# Patient Record
Sex: Male | Born: 1965 | Race: Black or African American | Hispanic: No | Smoking: Never smoker
Health system: Southern US, Community
[De-identification: ages and names within clinical notes are randomized; demographics above are authoritative.]

## PROBLEM LIST (undated history)

## (undated) DIAGNOSIS — J45909 Unspecified asthma, uncomplicated: Secondary | ICD-10-CM

## (undated) DIAGNOSIS — I10 Essential (primary) hypertension: Secondary | ICD-10-CM

## (undated) DIAGNOSIS — F329 Major depressive disorder, single episode, unspecified: Secondary | ICD-10-CM

## (undated) DIAGNOSIS — F209 Schizophrenia, unspecified: Secondary | ICD-10-CM

## (undated) DIAGNOSIS — J209 Acute bronchitis, unspecified: Secondary | ICD-10-CM

## (undated) DIAGNOSIS — F32A Depression, unspecified: Secondary | ICD-10-CM

---

## 1998-05-16 ENCOUNTER — Emergency Department (HOSPITAL_COMMUNITY): Admission: EM | Admit: 1998-05-16 | Discharge: 1998-05-16 | Payer: Self-pay | Admitting: Emergency Medicine

## 2000-07-16 ENCOUNTER — Encounter: Payer: Self-pay | Admitting: Emergency Medicine

## 2000-07-16 ENCOUNTER — Emergency Department (HOSPITAL_COMMUNITY): Admission: EM | Admit: 2000-07-16 | Discharge: 2000-07-16 | Payer: Self-pay | Admitting: Emergency Medicine

## 2003-05-28 ENCOUNTER — Emergency Department (HOSPITAL_COMMUNITY): Admission: AD | Admit: 2003-05-28 | Discharge: 2003-05-28 | Payer: Self-pay | Admitting: Emergency Medicine

## 2004-09-04 ENCOUNTER — Ambulatory Visit: Payer: Self-pay | Admitting: Internal Medicine

## 2004-09-18 ENCOUNTER — Ambulatory Visit: Payer: Self-pay | Admitting: Internal Medicine

## 2005-03-19 ENCOUNTER — Ambulatory Visit: Payer: Self-pay | Admitting: Family Medicine

## 2005-06-15 ENCOUNTER — Emergency Department (HOSPITAL_COMMUNITY): Admission: EM | Admit: 2005-06-15 | Discharge: 2005-06-15 | Payer: Self-pay | Admitting: Emergency Medicine

## 2006-03-07 ENCOUNTER — Ambulatory Visit: Payer: Self-pay | Admitting: Family Medicine

## 2006-08-13 ENCOUNTER — Encounter: Payer: Self-pay | Admitting: Family Medicine

## 2007-04-23 DIAGNOSIS — F329 Major depressive disorder, single episode, unspecified: Secondary | ICD-10-CM | POA: Insufficient documentation

## 2007-04-23 DIAGNOSIS — I10 Essential (primary) hypertension: Secondary | ICD-10-CM | POA: Insufficient documentation

## 2007-04-23 DIAGNOSIS — J45909 Unspecified asthma, uncomplicated: Secondary | ICD-10-CM | POA: Insufficient documentation

## 2007-04-23 DIAGNOSIS — F3289 Other specified depressive episodes: Secondary | ICD-10-CM | POA: Insufficient documentation

## 2007-04-27 ENCOUNTER — Ambulatory Visit: Payer: Self-pay | Admitting: Family Medicine

## 2007-04-27 DIAGNOSIS — F200 Paranoid schizophrenia: Secondary | ICD-10-CM | POA: Insufficient documentation

## 2007-04-27 LAB — CONVERTED CEMR LAB
Bilirubin Urine: NEGATIVE
Blood in Urine, dipstick: NEGATIVE
Glucose, Urine, Semiquant: NEGATIVE
Ketones, urine, test strip: NEGATIVE
Nitrite: NEGATIVE
Protein, U semiquant: NEGATIVE
Specific Gravity, Urine: 1.015
Urobilinogen, UA: NEGATIVE
WBC Urine, dipstick: NEGATIVE
pH: 5

## 2007-04-28 ENCOUNTER — Telehealth (INDEPENDENT_AMBULATORY_CARE_PROVIDER_SITE_OTHER): Payer: Self-pay | Admitting: *Deleted

## 2007-04-28 LAB — CONVERTED CEMR LAB
ALT: 27 units/L (ref 0–40)
AST: 19 units/L (ref 0–37)
Albumin: 4 g/dL (ref 3.5–5.2)
Alkaline Phosphatase: 71 units/L (ref 39–117)
BUN: 12 mg/dL (ref 6–23)
Basophils Absolute: 0 10*3/uL (ref 0.0–0.1)
Basophils Relative: 0.3 % (ref 0.0–1.0)
Bilirubin, Direct: 0.1 mg/dL (ref 0.0–0.3)
CO2: 32 meq/L (ref 19–32)
Calcium: 9.3 mg/dL (ref 8.4–10.5)
Chloride: 110 meq/L (ref 96–112)
Cholesterol: 293 mg/dL (ref 0–200)
Creatinine, Ser: 1.1 mg/dL (ref 0.4–1.5)
Direct LDL: 192.3 mg/dL
Eosinophils Absolute: 0.1 10*3/uL (ref 0.0–0.6)
Eosinophils Relative: 1.1 % (ref 0.0–5.0)
GFR calc Af Amer: 96 mL/min
GFR calc non Af Amer: 79 mL/min
Glucose, Bld: 74 mg/dL (ref 70–99)
HCT: 51.3 % (ref 39.0–52.0)
HDL: 50.6 mg/dL (ref >39.0)
Hemoglobin: 17.2 g/dL — ABNORMAL HIGH (ref 13.0–17.0)
Lymphocytes Relative: 13 % (ref 12.0–46.0)
MCHC: 33.4 g/dL (ref 30.0–36.0)
MCV: 89.3 fL (ref 78.0–100.0)
Monocytes Absolute: 0.4 10*3/uL (ref 0.2–0.7)
Monocytes Relative: 5.2 % (ref 3.0–11.0)
Neutro Abs: 6.8 10*3/uL (ref 1.4–7.7)
Neutrophils Relative %: 80.4 % — ABNORMAL HIGH (ref 43.0–77.0)
Platelets: 244 10*3/uL (ref 150–400)
Potassium: 4 meq/L (ref 3.5–5.1)
RBC: 5.74 M/uL (ref 4.22–5.81)
RDW: 12.4 % (ref 11.5–14.6)
Sodium: 145 meq/L (ref 135–145)
TSH: 0.71 microintl units/mL (ref 0.35–5.50)
Total Bilirubin: 0.8 mg/dL (ref 0.3–1.2)
Total CHOL/HDL Ratio: 5.8
Total Protein: 7.4 g/dL (ref 6.0–8.3)
Triglycerides: 165 mg/dL — ABNORMAL HIGH (ref 0–149)
VLDL: 33 mg/dL (ref 0–40)
WBC: 8.4 10*3/uL (ref 4.5–10.5)

## 2007-11-04 ENCOUNTER — Telehealth (INDEPENDENT_AMBULATORY_CARE_PROVIDER_SITE_OTHER): Payer: Self-pay | Admitting: *Deleted

## 2009-01-24 ENCOUNTER — Ambulatory Visit: Payer: Self-pay | Admitting: Family Medicine

## 2009-01-24 DIAGNOSIS — J209 Acute bronchitis, unspecified: Secondary | ICD-10-CM

## 2010-08-11 ENCOUNTER — Emergency Department (HOSPITAL_COMMUNITY): Admission: EM | Admit: 2010-08-11 | Discharge: 2010-08-11 | Payer: Self-pay | Admitting: Emergency Medicine

## 2011-11-06 DIAGNOSIS — E119 Type 2 diabetes mellitus without complications: Secondary | ICD-10-CM | POA: Diagnosis not present

## 2011-12-13 DIAGNOSIS — F200 Paranoid schizophrenia: Secondary | ICD-10-CM | POA: Diagnosis not present

## 2012-03-05 DIAGNOSIS — J309 Allergic rhinitis, unspecified: Secondary | ICD-10-CM | POA: Diagnosis not present

## 2012-03-05 DIAGNOSIS — E1110 Type 2 diabetes mellitus with ketoacidosis without coma: Secondary | ICD-10-CM | POA: Diagnosis not present

## 2012-03-05 DIAGNOSIS — I10 Essential (primary) hypertension: Secondary | ICD-10-CM | POA: Diagnosis not present

## 2012-03-05 DIAGNOSIS — F2089 Other schizophrenia: Secondary | ICD-10-CM | POA: Diagnosis not present

## 2012-03-05 DIAGNOSIS — G4730 Sleep apnea, unspecified: Secondary | ICD-10-CM | POA: Diagnosis not present

## 2012-03-05 DIAGNOSIS — E7800 Pure hypercholesterolemia, unspecified: Secondary | ICD-10-CM | POA: Diagnosis not present

## 2012-03-05 DIAGNOSIS — E119 Type 2 diabetes mellitus without complications: Secondary | ICD-10-CM | POA: Diagnosis not present

## 2012-03-11 DIAGNOSIS — F200 Paranoid schizophrenia: Secondary | ICD-10-CM | POA: Diagnosis not present

## 2012-05-04 DIAGNOSIS — I10 Essential (primary) hypertension: Secondary | ICD-10-CM | POA: Diagnosis not present

## 2012-06-16 DIAGNOSIS — F200 Paranoid schizophrenia: Secondary | ICD-10-CM | POA: Diagnosis not present

## 2012-07-03 DIAGNOSIS — R062 Wheezing: Secondary | ICD-10-CM | POA: Diagnosis not present

## 2012-08-10 DIAGNOSIS — E119 Type 2 diabetes mellitus without complications: Secondary | ICD-10-CM | POA: Diagnosis not present

## 2012-08-10 DIAGNOSIS — E7800 Pure hypercholesterolemia, unspecified: Secondary | ICD-10-CM | POA: Diagnosis not present

## 2012-08-10 DIAGNOSIS — I10 Essential (primary) hypertension: Secondary | ICD-10-CM | POA: Diagnosis not present

## 2012-09-01 DIAGNOSIS — F200 Paranoid schizophrenia: Secondary | ICD-10-CM | POA: Diagnosis not present

## 2012-11-09 DIAGNOSIS — Z23 Encounter for immunization: Secondary | ICD-10-CM | POA: Diagnosis not present

## 2012-11-09 DIAGNOSIS — E119 Type 2 diabetes mellitus without complications: Secondary | ICD-10-CM | POA: Diagnosis not present

## 2012-11-09 DIAGNOSIS — E781 Pure hyperglyceridemia: Secondary | ICD-10-CM | POA: Diagnosis not present

## 2012-11-09 DIAGNOSIS — E7800 Pure hypercholesterolemia, unspecified: Secondary | ICD-10-CM | POA: Diagnosis not present

## 2012-11-09 DIAGNOSIS — Z125 Encounter for screening for malignant neoplasm of prostate: Secondary | ICD-10-CM | POA: Diagnosis not present

## 2012-11-09 DIAGNOSIS — I10 Essential (primary) hypertension: Secondary | ICD-10-CM | POA: Diagnosis not present

## 2013-01-04 DIAGNOSIS — F200 Paranoid schizophrenia: Secondary | ICD-10-CM | POA: Diagnosis not present

## 2013-04-06 DIAGNOSIS — F200 Paranoid schizophrenia: Secondary | ICD-10-CM | POA: Diagnosis not present

## 2013-06-28 DIAGNOSIS — F209 Schizophrenia, unspecified: Secondary | ICD-10-CM | POA: Diagnosis not present

## 2013-06-28 DIAGNOSIS — F200 Paranoid schizophrenia: Secondary | ICD-10-CM | POA: Diagnosis not present

## 2013-09-20 DIAGNOSIS — F209 Schizophrenia, unspecified: Secondary | ICD-10-CM | POA: Diagnosis not present

## 2013-12-10 DIAGNOSIS — F209 Schizophrenia, unspecified: Secondary | ICD-10-CM | POA: Diagnosis not present

## 2013-12-16 DIAGNOSIS — E781 Pure hyperglyceridemia: Secondary | ICD-10-CM | POA: Diagnosis not present

## 2013-12-16 DIAGNOSIS — F2089 Other schizophrenia: Secondary | ICD-10-CM | POA: Diagnosis not present

## 2013-12-16 DIAGNOSIS — E119 Type 2 diabetes mellitus without complications: Secondary | ICD-10-CM | POA: Diagnosis not present

## 2013-12-16 DIAGNOSIS — I10 Essential (primary) hypertension: Secondary | ICD-10-CM | POA: Diagnosis not present

## 2014-02-28 DIAGNOSIS — F209 Schizophrenia, unspecified: Secondary | ICD-10-CM | POA: Diagnosis not present

## 2014-05-23 DIAGNOSIS — F209 Schizophrenia, unspecified: Secondary | ICD-10-CM | POA: Diagnosis not present

## 2014-08-15 DIAGNOSIS — F209 Schizophrenia, unspecified: Secondary | ICD-10-CM | POA: Diagnosis not present

## 2015-02-20 DIAGNOSIS — F209 Schizophrenia, unspecified: Secondary | ICD-10-CM | POA: Diagnosis not present

## 2015-02-24 DIAGNOSIS — I10 Essential (primary) hypertension: Secondary | ICD-10-CM | POA: Diagnosis not present

## 2015-02-24 DIAGNOSIS — E782 Mixed hyperlipidemia: Secondary | ICD-10-CM | POA: Diagnosis not present

## 2015-02-24 DIAGNOSIS — J4521 Mild intermittent asthma with (acute) exacerbation: Secondary | ICD-10-CM | POA: Diagnosis not present

## 2015-02-24 DIAGNOSIS — E0800 Diabetes mellitus due to underlying condition with hyperosmolarity without nonketotic hyperglycemic-hyperosmolar coma (NKHHC): Secondary | ICD-10-CM | POA: Diagnosis not present

## 2015-03-29 DIAGNOSIS — E0800 Diabetes mellitus due to underlying condition with hyperosmolarity without nonketotic hyperglycemic-hyperosmolar coma (NKHHC): Secondary | ICD-10-CM | POA: Diagnosis not present

## 2015-03-29 DIAGNOSIS — I10 Essential (primary) hypertension: Secondary | ICD-10-CM | POA: Diagnosis not present

## 2015-03-29 DIAGNOSIS — E782 Mixed hyperlipidemia: Secondary | ICD-10-CM | POA: Diagnosis not present

## 2015-05-18 DIAGNOSIS — F209 Schizophrenia, unspecified: Secondary | ICD-10-CM | POA: Diagnosis not present

## 2015-05-30 DIAGNOSIS — E782 Mixed hyperlipidemia: Secondary | ICD-10-CM | POA: Diagnosis not present

## 2015-05-30 DIAGNOSIS — I10 Essential (primary) hypertension: Secondary | ICD-10-CM | POA: Diagnosis not present

## 2015-05-30 DIAGNOSIS — E0800 Diabetes mellitus due to underlying condition with hyperosmolarity without nonketotic hyperglycemic-hyperosmolar coma (NKHHC): Secondary | ICD-10-CM | POA: Diagnosis not present

## 2015-07-15 DIAGNOSIS — E0800 Diabetes mellitus due to underlying condition with hyperosmolarity without nonketotic hyperglycemic-hyperosmolar coma (NKHHC): Secondary | ICD-10-CM | POA: Diagnosis not present

## 2015-07-15 DIAGNOSIS — J4521 Mild intermittent asthma with (acute) exacerbation: Secondary | ICD-10-CM | POA: Diagnosis not present

## 2015-07-15 DIAGNOSIS — I10 Essential (primary) hypertension: Secondary | ICD-10-CM | POA: Diagnosis not present

## 2015-08-14 DIAGNOSIS — F209 Schizophrenia, unspecified: Secondary | ICD-10-CM | POA: Diagnosis not present

## 2015-09-13 DIAGNOSIS — I10 Essential (primary) hypertension: Secondary | ICD-10-CM | POA: Diagnosis not present

## 2015-09-13 DIAGNOSIS — J4521 Mild intermittent asthma with (acute) exacerbation: Secondary | ICD-10-CM | POA: Diagnosis not present

## 2015-09-13 DIAGNOSIS — E118 Type 2 diabetes mellitus with unspecified complications: Secondary | ICD-10-CM | POA: Diagnosis not present

## 2015-09-13 DIAGNOSIS — F2089 Other schizophrenia: Secondary | ICD-10-CM | POA: Diagnosis not present

## 2015-11-07 DIAGNOSIS — F209 Schizophrenia, unspecified: Secondary | ICD-10-CM | POA: Diagnosis not present

## 2018-04-24 ENCOUNTER — Emergency Department (HOSPITAL_COMMUNITY)
Admission: EM | Admit: 2018-04-24 | Discharge: 2018-04-27 | Disposition: A | Discharge status: Other Acute Inpatient Hospital | Payer: Medicare Other | Attending: Emergency Medicine | Admitting: Emergency Medicine

## 2018-04-24 ENCOUNTER — Encounter (HOSPITAL_COMMUNITY): Payer: Self-pay | Admitting: Nurse Practitioner

## 2018-04-24 DIAGNOSIS — F200 Paranoid schizophrenia: Secondary | ICD-10-CM | POA: Diagnosis not present

## 2018-04-24 DIAGNOSIS — I10 Essential (primary) hypertension: Secondary | ICD-10-CM | POA: Insufficient documentation

## 2018-04-24 DIAGNOSIS — J45909 Unspecified asthma, uncomplicated: Secondary | ICD-10-CM | POA: Diagnosis not present

## 2018-04-24 DIAGNOSIS — Z87891 Personal history of nicotine dependence: Secondary | ICD-10-CM | POA: Insufficient documentation

## 2018-04-24 DIAGNOSIS — F329 Major depressive disorder, single episode, unspecified: Secondary | ICD-10-CM | POA: Diagnosis present

## 2018-04-24 HISTORY — DX: Schizophrenia, unspecified: F20.9

## 2018-04-24 HISTORY — DX: Depression, unspecified: F32.A

## 2018-04-24 HISTORY — DX: Unspecified asthma, uncomplicated: J45.909

## 2018-04-24 HISTORY — DX: Acute bronchitis, unspecified: J20.9

## 2018-04-24 HISTORY — DX: Essential (primary) hypertension: I10

## 2018-04-24 HISTORY — DX: Major depressive disorder, single episode, unspecified: F32.9

## 2018-04-24 LAB — COMPREHENSIVE METABOLIC PANEL
ALK PHOS: 65 U/L (ref 38–126)
ALT: 19 U/L (ref 17–63)
ANION GAP: 7 (ref 5–15)
AST: 20 U/L (ref 15–41)
Albumin: 3.8 g/dL (ref 3.5–5.0)
BILIRUBIN TOTAL: 0.8 mg/dL (ref 0.3–1.2)
BUN: 9 mg/dL (ref 6–20)
CO2: 26 mmol/L (ref 22–32)
Calcium: 8.8 mg/dL — ABNORMAL LOW (ref 8.9–10.3)
Chloride: 107 mmol/L (ref 101–111)
Creatinine, Ser: 1.06 mg/dL (ref 0.61–1.24)
Glucose, Bld: 97 mg/dL (ref 65–99)
Potassium: 4.1 mmol/L (ref 3.5–5.1)
SODIUM: 140 mmol/L (ref 135–145)
TOTAL PROTEIN: 6.6 g/dL (ref 6.5–8.1)

## 2018-04-24 LAB — ACETAMINOPHEN LEVEL

## 2018-04-24 LAB — RAPID URINE DRUG SCREEN, HOSP PERFORMED
Amphetamines: NOT DETECTED
Benzodiazepines: NOT DETECTED
COCAINE: NOT DETECTED
OPIATES: NOT DETECTED
Tetrahydrocannabinol: NOT DETECTED

## 2018-04-24 LAB — CBC
HCT: 43.2 % (ref 39.0–52.0)
Hemoglobin: 14.8 g/dL (ref 13.0–17.0)
MCH: 30.5 pg (ref 26.0–34.0)
MCHC: 34.3 g/dL (ref 30.0–36.0)
MCV: 89.1 fL (ref 78.0–100.0)
Platelets: 292 10*3/uL (ref 150–400)
RBC: 4.85 MIL/uL (ref 4.22–5.81)
RDW: 13.7 % (ref 11.5–15.5)
WBC: 9.2 10*3/uL (ref 4.0–10.5)

## 2018-04-24 LAB — ETHANOL

## 2018-04-24 LAB — SALICYLATE LEVEL

## 2018-04-24 MED ORDER — ACETAMINOPHEN 325 MG PO TABS: 650.0000 mg | ORAL_TABLET | ORAL | Status: DC | PRN

## 2018-04-24 MED ORDER — ONDANSETRON HCL 4 MG PO TABS: 4.0000 mg | ORAL_TABLET | Freq: Three times a day (TID) | ORAL | Status: DC | PRN

## 2018-04-24 MED ORDER — OLANZAPINE 10 MG PO TBDP
10.0000 mg | ORAL_TABLET | Freq: Once | ORAL | Status: AC
  Administered 2018-04-24: 10 mg via ORAL
  Filled 2018-04-24: qty 1

## 2018-04-24 MED ORDER — ALUM & MAG HYDROXIDE-SIMETH 200-200-20 MG/5ML PO SUSP: 30.0000 mL | Freq: Four times a day (QID) | ORAL | Status: DC | PRN

## 2018-04-24 NOTE — Progress Notes (Signed)
IVC states the pt's mother is his legal guardian, however chart does not support this information. Will need verification.   Princess BruinsAquicha Chris Narasimhan, MSW, LCSW Therapeutic Triage Specialist  [phone]

## 2018-04-24 NOTE — ED Triage Notes (Signed)
Pt IVC'd and presented by [employer], reportedly behaving erratic for the past few weeks, threatening to kill his mother and sister and hallucinating by talking to Obama and his deceased grandparents. Pt has a known psychiatric hx of schizophrenia and reportedly has not taken his medications for the last 3 years. Pt is calm and coopertive at this time.

## 2018-04-24 NOTE — ED Notes (Signed)
Bed: [id number] Expected date:  Expected time:  Means of arrival:  Comments: Med clearance with [employer]

## 2018-04-24 NOTE — Progress Notes (Signed)
TTS consulted with Donell SievertSpencer Simon, PA pt is recommended for inpt treatment. EDP Tilden Fossaees, Elizabeth, MD has been advised. Baxter HireKristen, RN has also been advised of the recommendation.   Princess BruinsAquicha Notnamed Croucher, MSW, LCSW Therapeutic Triage Specialist  [phone]

## 2018-04-24 NOTE — BH Assessment (Addendum)
Assessment Note  [name] [name] is an 52 y.o. male who presents to the ED under IVC initiated by his mother. According to the IVC, the respondent is not taking his medication, "believes he talks to Obama and his dead grandparents, thinks everyone is trying to hurt him or is talking about him; has threatened to kill his mother, sister, and his entire family; he sometimes walks outside without clothes on; behavior has become more and more erratic over the last weeks."  Pt appeared disoriented during the assessment and when asked to verify his DOB pt stated "[name] [name]." Pt is fixated on his grandparents and continues to make disorganized statements such as "I'm with my grandparents, 2011, I think about my grandparents. I want my wedding band, can you find my band, it was my grandparents, it took place in 2011, I guess."   TTS consulted with Donell SievertSpencer Simon, PA pt is recommended for inpt treatment. EDP Tilden Fossaees, Elizabeth, MD has been advised. Baxter HireKristen, RN has also been advised of the recommendation.   Diagnosis: Schizophrenia   Past Medical History:  Past Medical History:  Diagnosis Date  . Acute bronchitis   . Asthma   . Depression   . Hypertension   . Schizophrenia (HCC)     History reviewed. No pertinent surgical history.  Family History: No family history on file.  Social History:  reports that he has quit smoking. He does not have any smokeless tobacco history on file. He reports that he drank alcohol. He reports that he has current or past drug history.  Additional Social History:  Alcohol / Drug Use Pain Medications: See MAR Prescriptions: See MAR Over the Counter: See MAR History of alcohol / drug use?: No history of alcohol / drug abuse  CIWA: CIWA-Ar BP: (!) 134/98 Pulse Rate: 91 COWS:    Allergies:  Allergies  Allergen Reactions  . Cefprozil   . Cetirizine Hcl   . Doxycycline     Home Medications:  (Not in a hospital admission)  OB/GYN Status:  No LMP for male  patient.  General Assessment Data Location of Assessment: [facility] TTS Assessment: In system Is this a Tele or Face-to-Face Assessment?: Face-to-Face Is this an Initial Assessment or a Re-assessment for this encounter?: Initial Assessment Marital status: Single Is patient pregnant?: No Pregnancy Status: No Living Arrangements: Alone Can pt return to current living arrangement?: Yes Admission Status: Involuntary Is patient capable of signing voluntary admission?: No Referral Source: Self/Family/Friend Insurance type: none     Crisis Care Plan Living Arrangements: Alone Legal Guardian: Mother(per IVC, mother is guardian, Starling Mannsmma Fortier) Name of Psychiatrist: UTA due to AMS Name of Therapist: UTA due to AMS  Education Status Is patient currently in school?: No Is the patient employed, unemployed or receiving disability?: Unemployed  Risk to self with the past 6 months Suicidal Ideation: No(denies) Has patient been a risk to self within the past 6 months prior to admission? : No Suicidal Intent: No Has patient had any suicidal intent within the past 6 months prior to admission? : No Is patient at risk for suicide?: No Suicidal Plan?: No Has patient had any suicidal plan within the past 6 months prior to admission? : No Access to Means: No What has been your use of drugs/alcohol within the last 12 months?: denies Previous Attempts/Gestures: No Triggers for Past Attempts: None known Intentional Self Injurious Behavior: None Family Suicide History: No Recent stressful life event(s): Turmoil (Comment)(delusions, not taking meds ) Persecutory voices/beliefs?: No Depression:  No Substance abuse history and/or treatment for substance abuse?: No Suicide prevention information given to non-admitted patients: Not applicable  Risk to Others within the past 6 months Homicidal Ideation: Yes-Currently Present(IVC states pt threatened to kill family) Does patient have any lifetime risk of  violence toward others beyond the six months prior to admission? : Yes (comment)(IVC states pt threatened family) Thoughts of Harm to Others: Yes-Currently Present(per IVC) Comment - Thoughts of Harm to Others: IVC states pt threatened to kill family Current Homicidal Intent: No Current Homicidal Plan: No Access to Homicidal Means: No Identified Victim: family(per IVC) History of harm to others?: No Assessment of Violence: None Noted Does patient have access to weapons?: No Criminal Charges Pending?: No Does patient have a court date: No Is patient on probation?: No  Psychosis Hallucinations: Auditory, Visual Delusions: Grandiose  Mental Status Report Appearance/Hygiene: Disheveled, In scrubs Eye Contact: Fair Motor Activity: Freedom of movement Speech: Pressured, Loud Level of Consciousness: Alert Mood: Labile, Preoccupied Affect: Labile Anxiety Level: None Thought Processes: Irrelevant, Flight of Ideas Judgement: Impaired Orientation: Not oriented Obsessive Compulsive Thoughts/Behaviors: None  Cognitive Functioning Concentration: Poor Memory: Remote Impaired, Recent Impaired Is patient IDD: No Is patient DD?: No Insight: Poor Impulse Control: Poor Appetite: Fair Have you had any weight changes? : No Change Sleep: Unable to Assess Vegetative Symptoms: Unable to Assess  ADLScreening [facility] Assessment Services) Patient's cognitive ability adequate to safely complete daily activities?: Yes Patient able to express need for assistance with ADLs?: Yes Independently performs ADLs?: Yes (appropriate for developmental age)  Prior Inpatient Therapy Prior Inpatient Therapy: (UTA due to AMS)  Prior Outpatient Therapy Prior Outpatient Therapy: (UTA due to AMS)  ADL Screening (condition at time of admission) Patient's cognitive ability adequate to safely complete daily activities?: Yes Is the patient deaf or have difficulty hearing?: No Does the patient have difficulty  seeing, even when wearing glasses/contacts?: No Does the patient have difficulty concentrating, remembering, or making decisions?: Yes Patient able to express need for assistance with ADLs?: Yes Does the patient have difficulty dressing or bathing?: No Independently performs ADLs?: Yes (appropriate for developmental age) Does the patient have difficulty walking or climbing stairs?: No Weakness of Legs: None Weakness of Arms/Hands: None  Home Assistive Devices/Equipment Home Assistive Devices/Equipment: None    Abuse/Neglect Assessment (Assessment to be complete while patient is alone) Abuse/Neglect Assessment Can Be Completed: Unable to assess, patient is non-responsive or altered mental status     Advance Directives (For Healthcare) Does Patient Have a Medical Advance Directive?: No Would patient like information on creating a medical advance directive?: No - Patient declined    Additional Information 1:1 In Past 12 Months?: No CIRT Risk: Yes Elopement Risk: Yes Does patient have medical clearance?: Yes     Disposition: TTS consulted with Donell Sievert, PA pt is recommended for inpt treatment. EDP Tilden Fossa, MD has been advised. Baxter Hire, RN has also been advised of the recommendation.   Disposition Initial Assessment Completed for this Encounter: Yes Disposition of Patient: Admit Type of inpatient treatment program: Adult(per Donell Sievert, PA) Patient refused recommended treatment: No  On Site Evaluation by:   Reviewed with Physician:    Karolee Ohs 04/24/2018 8:17 PM

## 2018-04-24 NOTE — ED Provider Notes (Signed)
[facility]-EMERGENCY DEPT Provider Note   CSN: [id number] Arrival date & time: 04/24/18  1702     History   Chief Complaint Chief Complaint  Patient presents with  . Schizophrenia    HPI [name] [name] is a 52 y.o. male.  The history is provided by the patient. No language interpreter was used.    [name] [name] is a 52 y.o. male who presents to the Emergency Department complaining of psychiatric evaluation.  Level V caveat due to psychiatric disorder. He is brought to the emergency department under IVC for evaluation of bizarre behavior. Per IVC he is a danger to his self and others. He has a history of paranoid schizophrenia and has been prescribed Risperdal but has not been on it for three years. He believes he talks with Obama and his dead grandparents. He thinks everyone is trying to hurt him or is talking about him. He has threatened to kill his mother, sister and entire family. He sometimes walks outside without clothes on. Behavior has become more and more erratic over the last few weeks. Patient does not know why he is in the emergency department but says that he has been with his grandparents. He also repeatedly states [place]belline Zannie Kehr. He denies SI or HI in the emergency department. He states he has a history of schizophrenia but does not take medications. He does endorse drinking alcohol. He denies tobacco or drug use.  Past Medical History:  Diagnosis Date  . Acute bronchitis   . Asthma   . Depression   . Hypertension   . Schizophrenia [facility])     Patient Active Problem List   Diagnosis Date Noted  . ACUTE BRONCHITIS 01/24/2009  . SCHIZOPHRENIA, PARANOID, CHRONIC 04/27/2007  . DEPRESSION 04/23/2007  . HYPERTENSION 04/23/2007  . ASTHMA 04/23/2007    History reviewed. No pertinent surgical history.      Home Medications    Prior to Admission medications   Not on File    Family History No family history on file.  Social  History Social History   Tobacco Use  . Smoking status: Former Smoker  Substance Use Topics  . Alcohol use: Not Currently  . Drug use: Not Currently     Allergies   Cefprozil; Cetirizine hcl; and Doxycycline   Review of Systems Review of Systems  All other systems reviewed and are negative.    Physical Exam Updated Vital Signs BP 110/65 (BP Location: Right Arm)   Pulse 60   Temp 97.6 F (36.4 C) (Oral)   Resp 15   SpO2 98%   Physical Exam  Constitutional: He is oriented to person, place, and time. He appears well-developed and well-nourished.  HENT:  Head: Normocephalic and atraumatic.  Cardiovascular: Normal rate and regular rhythm.  No murmur heard. Pulmonary/Chest: Effort normal and breath sounds normal. No respiratory distress.  Abdominal: Soft. There is no tenderness. There is no rebound and no guarding.  Musculoskeletal: He exhibits no edema or tenderness.  Neurological: He is alert and oriented to person, place, and time.  Skin: Skin is warm and dry.  Psychiatric:  Mildly agitated. Tangential speech. Mildly paranoid.  Nursing note and vitals reviewed.    ED Treatments / Results  Labs (all labs ordered are listed, but only abnormal results are displayed) Labs Reviewed  COMPREHENSIVE METABOLIC PANEL - Abnormal; Notable for the following components:      Result Value   Calcium 8.8 (*)    All other components within  normal limits  ACETAMINOPHEN LEVEL - Abnormal; Notable for the following components:   Acetaminophen (Tylenol), Serum <10 (*)    All other components within normal limits  RAPID URINE DRUG SCREEN, HOSP PERFORMED - Abnormal; Notable for the following components:   Barbiturates   (*)    Value: Result not available. Reagent lot number recalled by manufacturer.   All other components within normal limits  ETHANOL  SALICYLATE LEVEL  CBC    EKG None  Radiology No results found.  Procedures Procedures (including critical care  time)  Medications Ordered in ED Medications  acetaminophen (TYLENOL) tablet 650 mg (has no administration in time range)  ondansetron (ZOFRAN) tablet 4 mg (has no administration in time range)  alum & mag hydroxide-simeth (MAALOX/MYLANTA) 200-200-20 MG/5ML suspension 30 mL (has no administration in time range)  OLANZapine zydis (ZYPREXA) disintegrating tablet 10 mg (10 mg Oral Given 04/24/18 1742)     Initial Impression / Assessment and Plan / ED Course  I have reviewed the triage vital signs and the nursing notes.  Pertinent labs & imaging results that were available during my care of the patient were reviewed by me and considered in my medical decision making (see chart for details).     Patient here under IVC for mania with homicidal ideation. Patient is mildly agitated but cooperative in the emergency department. He has been medically cleared for psychiatric evaluation and treatment.  Final Clinical Impressions(s) / ED Diagnoses   Final diagnoses:  None    ED Discharge Orders    None       Tilden Fossaees, Inice Sanluis, MD 04/25/18 [phone]

## 2018-04-25 ENCOUNTER — Other Ambulatory Visit: Payer: Self-pay

## 2018-04-25 DIAGNOSIS — F200 Paranoid schizophrenia: Secondary | ICD-10-CM

## 2018-04-25 MED ORDER — OXCARBAZEPINE 300 MG PO TABS
300.0000 mg | ORAL_TABLET | Freq: Two times a day (BID) | ORAL | Status: DC
  Administered 2018-04-25 – 2018-04-27 (×5): 300 mg via ORAL
  Filled 2018-04-25 (×5): qty 1

## 2018-04-25 MED ORDER — RISPERIDONE 1 MG PO TABS
1.0000 mg | ORAL_TABLET | Freq: Two times a day (BID) | ORAL | Status: DC
  Administered 2018-04-25 – 2018-04-27 (×5): 1 mg via ORAL
  Filled 2018-04-25 (×5): qty 1

## 2018-04-25 MED ORDER — BENZTROPINE MESYLATE 0.5 MG PO TABS
0.5000 mg | ORAL_TABLET | Freq: Two times a day (BID) | ORAL | Status: DC
  Administered 2018-04-25 – 2018-04-27 (×5): 0.5 mg via ORAL
  Filled 2018-04-25 (×5): qty 1

## 2018-04-25 NOTE — ED Notes (Signed)
Patient is mute at this time will not talk to Clinical research associatewriter at this time. Encouragement and support provided and safety maintain. Q 15 min safety checks remain in place and video monitoring.

## 2018-04-25 NOTE — Progress Notes (Signed)
CSW created and then received he signed 1st opinion/exam from the [facility]PD and placed copies into the IVC logbook and into the pt's chart.  Please reconsult if future social work needs arise.  CSW signing off, as social work intervention is no longer needed.  Jaelle Campanile F. Madden Piazza, LCSW, LCAS, CSI Clinical Social Worker Ph: [phone]  

## 2018-04-25 NOTE — ED Notes (Signed)
Nurse and tech watching video monitor when patient got up and started moving about his room, then crouched into a corner and defecated in styrofoam breakfast container.  When patient was brought to bathroom he said, "is that the bathroom?"  "I don't need to use it."

## 2018-04-25 NOTE — Consult Note (Signed)
[facility] Consult   Reason for Consult:  Bizarre behavior, threatened to kill family members Referring Physician:  EDP Patient Identification: [name] [name] MRN:  [id number] Principal Diagnosis: Paranoid schizophrenia, chronic condition ([place]) Diagnosis:   Patient Active Problem List   Diagnosis Date Noted  . SCHIZOPHRENIA, PARANOID, CHRONIC [F20.0] 04/27/2007    Priority: High  . ACUTE BRONCHITIS [J20.9] 01/24/2009  . DEPRESSION [F32.9] 04/23/2007  . HYPERTENSION [I10] 04/23/2007  . ASTHMA [J45.909] 04/23/2007    Total Time spent with patient: 45 minutes  Subjective:   [name] [name] is a 52 y.o. male patient admitted with paranoia, hostility.  HPI:  Patient with long history of Paranoid Schizophrenia who was IVC'd by his mother due to erratic behavior, paranoia, non-compliant with medications and increased hostility towards family. Patient is uncooperative, defiant, oppositional, argumentative. He reports that he has been ''talking  to Obama and his dead grandparents.'' He believes that everyone is trying to hurt him and people has been talking about him. Collateral information revealed that he has been threatening to kill his mother, sister, and his entire family. There an allegation that patient sometimes walks outside the home  without clothes on and has been getting hostile with people. He denies drugs and alcohol abuse.   Past Psychiatric History: as above  Risk to Self: Suicidal Ideation: No(denies) Suicidal Intent: No Is patient at risk for suicide?: No Suicidal Plan?: No Access to Means: No What has been your use of drugs/alcohol within the last 12 months?: denies Triggers for Past Attempts: None known Intentional Self Injurious Behavior: None Risk to Others: Homicidal Ideation: Yes-Currently Present(IVC states pt threatened to kill family) Thoughts of Harm to Others: Yes-Currently Present(per IVC) Comment - Thoughts of Harm to Others: IVC states  pt threatened to kill family Current Homicidal Intent: No Current Homicidal Plan: No Access to Homicidal Means: No Identified Victim: family(per IVC) History of harm to others?: No Assessment of Violence: None Noted Does patient have access to weapons?: No Criminal Charges Pending?: No Does patient have a court date: No Prior Inpatient Therapy: Prior Inpatient Therapy: (UTA due to AMS) Prior Outpatient Therapy: Prior Outpatient Therapy: (UTA due to AMS)  Past Medical History:  Past Medical History:  Diagnosis Date  . Acute bronchitis   . Asthma   . Depression   . Hypertension   . Schizophrenia ([place])    History reviewed. No pertinent surgical history. Family History: No family history on file. Family Psychiatric  History:  Social History:  Social History   Substance and Sexual Activity  Alcohol Use Not Currently     Social History   Substance and Sexual Activity  Drug Use Not Currently    Social History   Socioeconomic History  . Marital status: Legally Separated    Spouse name: Not on file  . Number of children: Not on file  . Years of education: Not on file  . Highest education level: Not on file  Occupational History  . Not on file  Social Needs  . Financial resource strain: Not on file  . Food insecurity:    Worry: Not on file    Inability: Not on file  . Transportation needs:    Medical: Not on file    Non-medical: Not on file  Tobacco Use  . Smoking status: Former Smoker  Substance and Sexual Activity  . Alcohol use: Not Currently  . Drug use: Not Currently  . Sexual activity: Not on file  Lifestyle  . Physical  activity:    Days per week: Not on file    Minutes per session: Not on file  . Stress: Not on file  Relationships  . Social connections:    Talks on phone: Not on file    Gets together: Not on file    Attends religious service: Not on file    Active member of club or organization: Not on file    Attends meetings of clubs or  organizations: Not on file    Relationship status: Not on file  Other Topics Concern  . Not on file  Social History Narrative  . Not on file   Additional Social History:    Allergies:   Allergies  Allergen Reactions  . Cefprozil   . Cetirizine Hcl   . Doxycycline     Labs:  Results for orders placed or performed during the hospital encounter of 04/24/18 (from the past 48 hour(s))  Comprehensive metabolic panel     Status: Abnormal   Collection Time: 04/24/18  5:49 PM  Result Value Ref Range   Sodium 140 135 - 145 mmol/L   Potassium 4.1 3.5 - 5.1 mmol/L   Chloride 107 101 - 111 mmol/L   CO2 26 22 - 32 mmol/L   Glucose, Bld 97 65 - 99 mg/dL   BUN 9 6 - 20 mg/dL   Creatinine, Ser 1.06 0.61 - 1.24 mg/dL   Calcium 8.8 (L) 8.9 - 10.3 mg/dL   Total Protein 6.6 6.5 - 8.1 g/dL   Albumin 3.8 3.5 - 5.0 g/dL   AST 20 15 - 41 U/L   ALT 19 17 - 63 U/L   Alkaline Phosphatase 65 38 - 126 U/L   Total Bilirubin 0.8 0.3 - 1.2 mg/dL   GFR calc non Af Amer >60 >60 mL/min   GFR calc Af Amer >60 >60 mL/min    Comment: (NOTE) The eGFR has been calculated using the CKD EPI equation. This calculation has not been validated in all clinical situations. eGFR's persistently <60 mL/min signify possible Chronic Kidney Disease.    Anion gap 7 5 - 15    Comment: Performed at [facility], [place] [address], [place], [place] [address]  Ethanol     Status: None   Collection Time: 04/24/18  5:49 PM  Result Value Ref Range   Alcohol, Ethyl (B) <10 <10 mg/dL    Comment: (NOTE) Lowest detectable limit for serum alcohol is 10 mg/dL. For medical purposes only. Performed at [facility], [place] [address], [place], [place] [address]   Salicylate level     Status: None   Collection Time: 04/24/18  5:49 PM  Result Value Ref Range   Salicylate Lvl <1.7 2.8 - 30.0 mg/dL    Comment: Performed at [facility], [place] [address] [place], [place] [address]   Acetaminophen level     Status: Abnormal   Collection Time: 04/24/18  5:49 PM  Result Value Ref Range   Acetaminophen (Tylenol), Serum <10 (L) 10 - 30 ug/mL    Comment: (NOTE) Therapeutic concentrations vary significantly. A range of 10-30 ug/mL  may be an effective concentration for many patients. However, some  are best treated at concentrations outside of this range. Acetaminophen concentrations >150 ug/mL at 4 hours after ingestion  and >50 ug/mL at 12 hours after ingestion are often associated with  toxic reactions. Performed at [facility], [place] [address] [place], [place] [address]   cbc  Status: None   Collection Time: 04/24/18  5:49 PM  Result Value Ref Range   WBC 9.2 4.0 - 10.5 K/uL   RBC 4.85 4.22 - 5.81 MIL/uL   Hemoglobin 14.8 13.0 - 17.0 g/dL   HCT 43.2 39.0 - 52.0 %   MCV 89.1 78.0 - 100.0 fL   MCH 30.5 26.0 - 34.0 pg   MCHC 34.3 30.0 - 36.0 g/dL   RDW 13.7 11.5 - 15.5 %   Platelets 292 150 - 400 K/uL    Comment: Performed at [facility], [place] [address], [place], [place] [address]  Rapid urine drug screen (hospital performed)     Status: Abnormal   Collection Time: 04/24/18  5:49 PM  Result Value Ref Range   Opiates NONE DETECTED NONE DETECTED   Cocaine NONE DETECTED NONE DETECTED   Benzodiazepines NONE DETECTED NONE DETECTED   Amphetamines NONE DETECTED NONE DETECTED   Tetrahydrocannabinol NONE DETECTED NONE DETECTED   Barbiturates (A) NONE DETECTED    Result not available. Reagent lot number recalled by manufacturer.    Comment: Performed at [facility], [place] [address] [place], [place] [address]    Current Facility-Administered Medications  Medication Dose Route Frequency Provider Last Rate Last Dose  . acetaminophen (TYLENOL) tablet 650 mg  650 mg Oral Q4H PRN Quintella Reichert, MD      . alum & mag hydroxide-simeth (MAALOX/MYLANTA) 200-200-20 MG/5ML suspension 30 mL  30 mL Oral Q6H PRN Quintella Reichert, MD      . benztropine (COGENTIN) tablet 0.5 mg  0.5 mg Oral BID Shakiara Lukic, MD      . ondansetron (ZOFRAN) tablet 4 mg  4 mg Oral Q8H PRN Quintella Reichert, MD      . Oxcarbazepine (TRILEPTAL) tablet 300 mg  300 mg Oral BID Airen Stiehl, MD      . risperiDONE (RISPERDAL) tablet 1 mg  1 mg Oral BID Corena Pilgrim, MD       No current outpatient medications on file.    Musculoskeletal: Strength & Muscle Tone: within normal limits Gait & Station: normal Patient leans: N/A  Psychiatric Specialty Exam: Physical Exam  Psychiatric: His affect is labile. His speech is tangential. He is agitated, aggressive and actively hallucinating. Thought content is paranoid. Cognition and memory are normal. He expresses impulsivity.    Review of Systems  Constitutional: Negative.   HENT: Negative.   Eyes: Negative.   Respiratory: Negative.   Cardiovascular: Negative.   Gastrointestinal: Negative.   Genitourinary: Negative.   Musculoskeletal: Negative.   Skin: Negative.   Neurological: Negative.   Endo/Heme/Allergies: Negative.   Psychiatric/Behavioral: Positive for hallucinations.    Blood pressure (!) 108/56, pulse 78, temperature (!) 97.1 F (36.2 C), temperature source Oral, resp. rate 16, SpO2 100 %.There is no height or weight on file to calculate BMI.  General Appearance: Casual  Eye Contact:  Minimal  Speech:  Pressured  Volume:  Increased  Mood:  Irritable  Affect:  Labile  Thought Process:  Disorganized  Orientation:  Full (Time, Place, and Person)  Thought Content:  Delusions and Hallucinations: Auditory  Suicidal Thoughts:  No  Homicidal Thoughts:  No  Memory:  Immediate;   Fair Recent;   Fair Remote;   Fair  Judgement:  Poor  Insight:  Shallow  Psychomotor Activity:  Psychomotor Retardation  Concentration:  Concentration: Fair and Attention Span: Fair  Recall:  [employer] of Knowledge:  Fair  Language:  Good  Akathisia:  No  Handed:  Right  AIMS  (if indicated):     Assets:  Social Support  ADL's:  Intact  Cognition:  WNL  Sleep:   poor     Treatment Plan Summary: Daily contact with patient to assess and evaluate symptoms and progress in treatment and Medication management  Oxcarbazepine 300 mg bid for mood stabilization Risperidone '1mg'$  bid for psychosis/delusion Cogentin 0.5 mg bid for EPS preventions Chart/lab reviewed. Crisis stabilization  Disposition: Recommend psychiatric Inpatient admission when medically cleared.  Corena Pilgrim, MD 04/25/2018 11:10 AM

## 2018-04-25 NOTE — ED Notes (Signed)
Dr. Jannifer FranklinAkintayo tried to talk to patient and asked what we could do to help him.  Patient had his hand down his pants and said, "All I am going to say is I want some M&M's, some potato chips, and a coke."

## 2018-04-25 NOTE — ED Notes (Signed)
Patient's mother wanted to come back and visit with him, but patient refused.  Mother also wanted patient's keys to close car windows.  AC, [place], notified to inquire if staff is allowed to give patient's belongings to family.  [place] called back and reported that staff cannot give out patient belongings without patient's permission.  Permission was unable to be obtained due to patient's mental condition.  Patient's mother notified.

## 2018-04-26 NOTE — ED Notes (Signed)
Patient took medication without resistance. Patient is now calm and cooperative. Patient was polite and thank me for giving him his medication. Patient currently denies SI/HI/AVH at this time. Will continue to monitor patient. Q 15 min safety checks remain in place and video monitoring.

## 2018-04-26 NOTE — ED Notes (Signed)
Patient has paced several times during shift but interacted appropriately with peers and staff. Patient has been polite and calm and cooperative during this shift.

## 2018-04-26 NOTE — ED Notes (Signed)
SBAR Report received from previous nurse. Pt received calm and visible on unit. Pt denies current SI/ HI, A/V H, depression, anxiety, or pain at this time, and appears otherwise stable and free of distress. Pt reminded of camera surveillance, q 15 min rounds, and rules of the milieu. Will continue to assess. 

## 2018-04-26 NOTE — BH Assessment (Addendum)
[facility] Assessment Progress Note     Upon today's evaluation, Dr. Jannifer FranklinAkintayo feels like patient continues to meet inpatient criteria.  Patient is pleasant, but continues to have some mild agitation and has been pacing around the unit.  Patient exhibited some bizarre behavior yesterday. There was an incident where he defecated in his room rather than asking staff where the bath was located.  Patient continues to be disorganized and appears to continue to exhibit psychotic features  Indicating that he has conversations with his deceased grandparents and President Obama.  [facility] one becomes available.  Currently there ar staffing issues due to have three 1:1 on that hallway.

## 2018-04-26 NOTE — Progress Notes (Signed)
Per Psychiatrist Akintayo and NP [facility], patient continues to meet inpatient criteria for psychiatric hospitalization. CSW [facility], [facility], [facility], [facility], [address], [address], [facility]. CSW will continue to seek placement for patient.   [name] SickleKimberly Shantae [name], [place]CSWA Clinical Social Worker [facility] Cell#: [phone]

## 2018-04-26 NOTE — ED Notes (Signed)
Pt has been pacing the hall today, but has not been problematic in the milieu. Sometimes he stands in his room watching TV and occasionally he gets into bed for a few minutes. Pt asked for a book and was given one.

## 2018-04-27 DIAGNOSIS — Z9114 Patient's other noncompliance with medication regimen: Secondary | ICD-10-CM | POA: Diagnosis not present

## 2018-04-27 DIAGNOSIS — J45909 Unspecified asthma, uncomplicated: Secondary | ICD-10-CM | POA: Diagnosis not present

## 2018-04-27 DIAGNOSIS — Z888 Allergy status to other drugs, medicaments and biological substances status: Secondary | ICD-10-CM | POA: Diagnosis not present

## 2018-04-27 DIAGNOSIS — I10 Essential (primary) hypertension: Secondary | ICD-10-CM | POA: Diagnosis present

## 2018-04-27 DIAGNOSIS — F200 Paranoid schizophrenia: Secondary | ICD-10-CM | POA: Diagnosis not present

## 2018-04-27 DIAGNOSIS — Z881 Allergy status to other antibiotic agents status: Secondary | ICD-10-CM | POA: Diagnosis not present

## 2018-04-27 DIAGNOSIS — Z87891 Personal history of nicotine dependence: Secondary | ICD-10-CM | POA: Diagnosis not present

## 2018-04-27 DIAGNOSIS — F250 Schizoaffective disorder, bipolar type: Secondary | ICD-10-CM | POA: Diagnosis not present

## 2018-04-27 MED ORDER — RISPERIDONE MICROSPHERES 25 MG IM SUSR
25.0000 mg | INTRAMUSCULAR | Status: DC
  Administered 2018-04-27: 25 mg via INTRAMUSCULAR
  Filled 2018-04-27: qty 2

## 2018-04-27 NOTE — ED Notes (Signed)
Report called to Onalee Huaavid RN at [facility].  Sheriff called for transport.

## 2018-04-27 NOTE — BH Assessment (Signed)
[facility]  Per Juanetta BeetsJacqueline Norman, DO, this pt requires psychiatric hospitalization at this time.  Pt presents under IVC initiated by pt's mother/guardian, Jolyn LentEmma Lee Fortier, which Thedore MinsMojeed Akintayo, MD has upheld.  At 15:30 Thayer OhmChris calls from [facility] to report that pt has been accepted to their facility by Dr Danae ChenLamba to the [employer] unit, Rm 240-2.  Laveda AbbeLaurie Britton Parks, FNP concurs with this decision.  Pt's nurse, Aram BeechamCynthia, has been notified, and agrees to call report to [phone].  [facility]  [facility] stipulates that pt must arrive either before 23:00 tonight, or after 06:00 tomorrow.  At 15:39 this writer called pt's mother/guardian, Jolyn Lentmma Lee Fortier [phone]) to notify her of pt's disposition.  Call rolled to voice mail, and I left a HIPPA compliant voice message.  Return call is pending as of this writing.  Doylene Canninghomas Zayneb Baucum Behavioral Health Coordinator [phone]

## 2018-04-27 NOTE — Consult Note (Addendum)
[facility]   Reason for Consult:  Bizarre behavior, threatened to kill family members Referring Physician:  EDP Patient Identification: [name] [name] MRN:  [id number] Principal Diagnosis: Paranoid schizophrenia, chronic condition ([facility]) Diagnosis:   Patient Active Problem List   Diagnosis Date Noted  . ACUTE BRONCHITIS [J20.9] 01/24/2009  . SCHIZOPHRENIA, PARANOID, CHRONIC [F20.0] 04/27/2007  . DEPRESSION [F32.9] 04/23/2007  . HYPERTENSION [I10] 04/23/2007  . ASTHMA [J45.909] 04/23/2007    Total Time spent with patient: 45 minutes  Subjective:   [name] [name] is a 52 y.o. male patient admitted with paranoia, hostility.  HPI:  Patient with long history of paranoid schizophrenia who was IVC'd by his mother due to erratic behavior, paranoia, non-compliant with medications and increased hostility towards family. Patient is calm and cooperative on the unit and is taking his medications. Collateral information revealed that he has been threatening to kill his mother, sister, and his entire family. There an allegation that patient sometimes walks outside the home  without clothes on and has been getting hostile with people. He denies drugs and alcohol abuse. Pt would benefit from an inpatient psychiatric admission for crisis stabilization and medication management.   Past Psychiatric History: as above  Risk to Self: Suicidal Ideation: No(denies) Suicidal Intent: No Is patient at risk for suicide?: No Suicidal Plan?: No Access to Means: No What has been your use of drugs/alcohol within the last 12 months?: denies Triggers for Past Attempts: None known Intentional Self Injurious Behavior: None Risk to Others: Homicidal Ideation: Yes-Currently Present(IVC states pt threatened to kill family) Thoughts of Harm to Others: Yes-Currently Present(per IVC) Comment - Thoughts of Harm to Others: IVC states pt threatened to kill family Current Homicidal Intent:  No Current Homicidal Plan: No Access to Homicidal Means: No Identified Victim: family(per IVC) History of harm to others?: No Assessment of Violence: None Noted Does patient have access to weapons?: No Criminal Charges Pending?: No Does patient have a court date: No Prior Inpatient Therapy: Prior Inpatient Therapy: (UTA due to AMS) Prior Outpatient Therapy: Prior Outpatient Therapy: (UTA due to AMS)  Past Medical History:  Past Medical History:  Diagnosis Date  . Acute bronchitis   . Asthma   . Depression   . Hypertension   . Schizophrenia ([facility])    History reviewed. No pertinent surgical history. Family History: No family history on file. Family Psychiatric  History: Unknown Social History:  Social History   Substance and Sexual Activity  Alcohol Use Not Currently     Social History   Substance and Sexual Activity  Drug Use Not Currently    Social History   Socioeconomic History  . Marital status: Legally Separated    Spouse name: Not on file  . Number of children: Not on file  . Years of education: Not on file  . Highest education level: Not on file  Occupational History  . Not on file  Social Needs  . Financial resource strain: Not on file  . Food insecurity:    Worry: Not on file    Inability: Not on file  . Transportation needs:    Medical: Not on file    Non-medical: Not on file  Tobacco Use  . Smoking status: Former Smoker  Substance and Sexual Activity  . Alcohol use: Not Currently  . Drug use: Not Currently  . Sexual activity: Not on file  Lifestyle  . Physical activity:    Days per week: Not on file    Minutes  per session: Not on file  . Stress: Not on file  Relationships  . Social connections:    Talks on phone: Not on file    Gets together: Not on file    Attends religious service: Not on file    Active member of club or organization: Not on file    Attends meetings of clubs or organizations: Not on file    Relationship status: Not on  file  Other Topics Concern  . Not on file  Social History Narrative  . Not on file   Additional Social History:    Allergies:   Allergies  Allergen Reactions  . Cefprozil   . Cetirizine Hcl   . Doxycycline     Labs:  No results found for this or any previous visit (from the past 48 hour(s)).  Current Facility-Administered Medications  Medication Dose Route Frequency Provider Last Rate Last Dose  . acetaminophen (TYLENOL) tablet 650 mg  650 mg Oral Q4H PRN Tilden Fossa, MD      . alum & mag hydroxide-simeth (MAALOX/MYLANTA) 200-200-20 MG/5ML suspension 30 mL  30 mL Oral Q6H PRN Tilden Fossa, MD      . benztropine (COGENTIN) tablet 0.5 mg  0.5 mg Oral BID Jannifer Franklin, Mojeed, MD   0.5 mg at 04/27/18 1023  . ondansetron (ZOFRAN) tablet 4 mg  4 mg Oral Q8H PRN Tilden Fossa, MD      . Oxcarbazepine (TRILEPTAL) tablet 300 mg  300 mg Oral BID Jannifer Franklin, Mojeed, MD   300 mg at 04/27/18 1023  . risperiDONE (RISPERDAL) tablet 1 mg  1 mg Oral BID Jannifer Franklin, Mojeed, MD   1 mg at 04/27/18 1023   No current outpatient medications on file.    Musculoskeletal: Strength & Muscle Tone: within normal limits Gait & Station: normal Patient leans: N/A  Psychiatric Specialty Exam: Physical Exam  Nursing note and vitals reviewed. Constitutional: He is oriented to person, place, and time. He appears well-developed and well-nourished.  HENT:  Head: Normocephalic and atraumatic.  Neck: Normal range of motion.  Respiratory: Effort normal.  Musculoskeletal: Normal range of motion.  Neurological: He is alert and oriented to person, place, and time.  Psychiatric: His affect is labile. His speech is tangential. He is agitated, aggressive and actively hallucinating. Thought content is paranoid. Cognition and memory are normal. He expresses impulsivity.    Review of Systems  Constitutional: Negative.   HENT: Negative.   Eyes: Negative.   Respiratory: Negative.   Cardiovascular: Negative.    Gastrointestinal: Negative.   Genitourinary: Negative.   Musculoskeletal: Negative.   Skin: Negative.   Neurological: Negative.   Endo/Heme/Allergies: Negative.   Psychiatric/Behavioral: Positive for hallucinations. Negative for depression, memory loss, substance abuse and suicidal ideas. The patient is not nervous/anxious and does not have insomnia.     Blood pressure 135/84, pulse 74, temperature 98.6 F (37 C), temperature source Oral, resp. rate 12, SpO2 97 %.There is no height or weight on file to calculate BMI.  General Appearance: Casual  Eye Contact:  Fair  Speech:  Pressured  Volume:  Normal  Mood:  Euthymic  Affect:  Constricted  Thought Process:  Disorganized although improving.   Orientation:  Full (Time, Place, and Person)  Thought Content:  Delusions and Hallucinations: Auditory  Suicidal Thoughts:  No  Homicidal Thoughts:  No  Memory:  Immediate;   Fair Recent;   Fair Remote;   Fair  Judgement:  Poor  Insight:  Shallow  Psychomotor Activity:  Psychomotor Retardation  Concentration:  Concentration: Fair and Attention Span: Fair  Recall:  [employer] of Knowledge:  Fair  Language:  Good  Akathisia:  No  Handed:  Right  AIMS (if indicated):   N/A  Assets:  Social Support  ADL's:  Intact  Cognition:  WNL  Sleep:   poor     Treatment Plan Summary: Daily contact with patient to assess and evaluate symptoms and progress in treatment and Medication management  Oxcarbazepine 300 mg bid for mood stabilization Risperidone 1mg  bid for psychosis/delusion. Received Risperdal Consta 25 mg today.  Cogentin 0.5 mg bid for EPS prevention Chart/lab reviewed. Crisis stabilization  Disposition: Recommend psychiatric Inpatient admission when medically cleared.  Laveda Abbe, NP 04/27/2018 12:14 PM   Patient seen face-to-face for psychiatric evaluation, chart reviewed and case discussed with the physician extender and developed treatment plan. Reviewed the  information documented and agree with the treatment plan.  Juanetta Beets, DO 04/27/18 3:02 PM

## 2018-04-27 NOTE — ED Notes (Signed)
Patient allowed nurse to give him the injection without difficulty.  Patient remains calm and cooperative.

## 2018-04-28 NOTE — BH Assessment (Signed)
[facility]  At 08:03 today pt's mother/guardian, Jolyn LentEmma Lee Fortier, calls.  I informed her that [facility], providing her with contact information.  Doylene Canninghomas Albin Duckett, [place] Behavioral Health Coordinator [phone]

## 2018-05-19 DIAGNOSIS — F250 Schizoaffective disorder, bipolar type: Secondary | ICD-10-CM | POA: Diagnosis not present

## 2018-06-02 DIAGNOSIS — F250 Schizoaffective disorder, bipolar type: Secondary | ICD-10-CM | POA: Diagnosis not present

## 2018-06-04 DIAGNOSIS — I10 Essential (primary) hypertension: Secondary | ICD-10-CM | POA: Diagnosis not present

## 2018-06-04 DIAGNOSIS — Z6827 Body mass index (BMI) 27.0-27.9, adult: Secondary | ICD-10-CM | POA: Diagnosis not present

## 2018-06-04 DIAGNOSIS — E782 Mixed hyperlipidemia: Secondary | ICD-10-CM | POA: Diagnosis not present

## 2018-06-04 DIAGNOSIS — F2089 Other schizophrenia: Secondary | ICD-10-CM | POA: Diagnosis not present

## 2018-06-04 DIAGNOSIS — E0800 Diabetes mellitus due to underlying condition with hyperosmolarity without nonketotic hyperglycemic-hyperosmolar coma (NKHHC): Secondary | ICD-10-CM | POA: Diagnosis not present

## 2018-06-05 ENCOUNTER — Other Ambulatory Visit: Payer: Self-pay | Admitting: Family Medicine

## 2018-06-05 ENCOUNTER — Ambulatory Visit
Admission: RE | Admit: 2018-06-05 | Discharge: 2018-06-05 | Disposition: A | Discharge status: Home / Self Care | Payer: Medicare Other | Source: Ambulatory Visit | Attending: Family Medicine | Admitting: Family Medicine

## 2018-06-05 DIAGNOSIS — R05 Cough: Secondary | ICD-10-CM | POA: Diagnosis not present

## 2018-06-05 DIAGNOSIS — R0602 Shortness of breath: Secondary | ICD-10-CM

## 2018-06-11 DIAGNOSIS — E782 Mixed hyperlipidemia: Secondary | ICD-10-CM | POA: Diagnosis not present

## 2018-06-11 DIAGNOSIS — J4521 Mild intermittent asthma with (acute) exacerbation: Secondary | ICD-10-CM | POA: Diagnosis not present

## 2018-06-11 DIAGNOSIS — F2089 Other schizophrenia: Secondary | ICD-10-CM | POA: Diagnosis not present

## 2018-06-30 DIAGNOSIS — F250 Schizoaffective disorder, bipolar type: Secondary | ICD-10-CM | POA: Diagnosis not present

## 2018-07-23 ENCOUNTER — Ambulatory Visit
Admission: RE | Admit: 2018-07-23 | Discharge: 2018-07-23 | Disposition: A | Discharge status: Home / Self Care | Payer: Medicare Other | Source: Ambulatory Visit | Attending: Family Medicine | Admitting: Family Medicine

## 2018-07-23 ENCOUNTER — Other Ambulatory Visit: Payer: Self-pay | Admitting: Family Medicine

## 2018-07-23 DIAGNOSIS — J4521 Mild intermittent asthma with (acute) exacerbation: Secondary | ICD-10-CM | POA: Diagnosis not present

## 2018-07-23 DIAGNOSIS — R0689 Other abnormalities of breathing: Secondary | ICD-10-CM

## 2018-07-23 DIAGNOSIS — R05 Cough: Secondary | ICD-10-CM | POA: Diagnosis not present

## 2018-07-23 DIAGNOSIS — F2089 Other schizophrenia: Secondary | ICD-10-CM | POA: Diagnosis not present

## 2018-07-23 DIAGNOSIS — R069 Unspecified abnormalities of breathing: Secondary | ICD-10-CM | POA: Diagnosis not present

## 2018-07-23 DIAGNOSIS — E782 Mixed hyperlipidemia: Secondary | ICD-10-CM | POA: Diagnosis not present

## 2018-07-28 DIAGNOSIS — F250 Schizoaffective disorder, bipolar type: Secondary | ICD-10-CM | POA: Diagnosis not present

## 2018-08-17 DIAGNOSIS — R0989 Other specified symptoms and signs involving the circulatory and respiratory systems: Secondary | ICD-10-CM | POA: Diagnosis not present

## 2018-08-17 DIAGNOSIS — I10 Essential (primary) hypertension: Secondary | ICD-10-CM | POA: Diagnosis not present

## 2018-08-17 DIAGNOSIS — R05 Cough: Secondary | ICD-10-CM | POA: Diagnosis not present

## 2018-08-25 DIAGNOSIS — F250 Schizoaffective disorder, bipolar type: Secondary | ICD-10-CM | POA: Diagnosis not present

## 2018-09-22 DIAGNOSIS — F250 Schizoaffective disorder, bipolar type: Secondary | ICD-10-CM | POA: Diagnosis not present

## 2018-10-12 DIAGNOSIS — K219 Gastro-esophageal reflux disease without esophagitis: Secondary | ICD-10-CM | POA: Diagnosis not present

## 2018-10-12 DIAGNOSIS — I10 Essential (primary) hypertension: Secondary | ICD-10-CM | POA: Diagnosis not present

## 2018-10-12 DIAGNOSIS — J45909 Unspecified asthma, uncomplicated: Secondary | ICD-10-CM | POA: Diagnosis not present

## 2018-10-12 DIAGNOSIS — G8929 Other chronic pain: Secondary | ICD-10-CM | POA: Diagnosis not present

## 2018-10-13 DIAGNOSIS — F250 Schizoaffective disorder, bipolar type: Secondary | ICD-10-CM | POA: Diagnosis not present

## 2018-10-20 DIAGNOSIS — F250 Schizoaffective disorder, bipolar type: Secondary | ICD-10-CM | POA: Diagnosis not present

## 2018-11-13 DIAGNOSIS — J45909 Unspecified asthma, uncomplicated: Secondary | ICD-10-CM | POA: Diagnosis not present

## 2018-11-13 DIAGNOSIS — G8929 Other chronic pain: Secondary | ICD-10-CM | POA: Diagnosis not present

## 2018-11-13 DIAGNOSIS — I10 Essential (primary) hypertension: Secondary | ICD-10-CM | POA: Diagnosis not present

## 2018-11-13 DIAGNOSIS — K219 Gastro-esophageal reflux disease without esophagitis: Secondary | ICD-10-CM | POA: Diagnosis not present

## 2018-11-17 DIAGNOSIS — F250 Schizoaffective disorder, bipolar type: Secondary | ICD-10-CM | POA: Diagnosis not present

## 2018-12-14 DIAGNOSIS — G8929 Other chronic pain: Secondary | ICD-10-CM | POA: Diagnosis not present

## 2018-12-14 DIAGNOSIS — I10 Essential (primary) hypertension: Secondary | ICD-10-CM | POA: Diagnosis not present

## 2018-12-14 DIAGNOSIS — K219 Gastro-esophageal reflux disease without esophagitis: Secondary | ICD-10-CM | POA: Diagnosis not present

## 2018-12-14 DIAGNOSIS — J45909 Unspecified asthma, uncomplicated: Secondary | ICD-10-CM | POA: Diagnosis not present

## 2018-12-15 DIAGNOSIS — F250 Schizoaffective disorder, bipolar type: Secondary | ICD-10-CM | POA: Diagnosis not present

## 2019-01-07 DIAGNOSIS — F250 Schizoaffective disorder, bipolar type: Secondary | ICD-10-CM | POA: Diagnosis not present

## 2019-01-11 DIAGNOSIS — K219 Gastro-esophageal reflux disease without esophagitis: Secondary | ICD-10-CM | POA: Diagnosis not present

## 2019-01-11 DIAGNOSIS — J45909 Unspecified asthma, uncomplicated: Secondary | ICD-10-CM | POA: Diagnosis not present

## 2019-01-11 DIAGNOSIS — I10 Essential (primary) hypertension: Secondary | ICD-10-CM | POA: Diagnosis not present

## 2019-01-11 DIAGNOSIS — F209 Schizophrenia, unspecified: Secondary | ICD-10-CM | POA: Diagnosis not present

## 2019-02-03 DIAGNOSIS — F250 Schizoaffective disorder, bipolar type: Secondary | ICD-10-CM | POA: Diagnosis not present

## 2019-04-19 DIAGNOSIS — F209 Schizophrenia, unspecified: Secondary | ICD-10-CM | POA: Diagnosis not present

## 2019-04-19 DIAGNOSIS — J45909 Unspecified asthma, uncomplicated: Secondary | ICD-10-CM | POA: Diagnosis not present

## 2019-04-19 DIAGNOSIS — I10 Essential (primary) hypertension: Secondary | ICD-10-CM | POA: Diagnosis not present

## 2019-04-19 DIAGNOSIS — K219 Gastro-esophageal reflux disease without esophagitis: Secondary | ICD-10-CM | POA: Diagnosis not present

## 2019-05-20 DIAGNOSIS — K219 Gastro-esophageal reflux disease without esophagitis: Secondary | ICD-10-CM | POA: Diagnosis not present

## 2019-05-20 DIAGNOSIS — I10 Essential (primary) hypertension: Secondary | ICD-10-CM | POA: Diagnosis not present

## 2019-05-20 DIAGNOSIS — F209 Schizophrenia, unspecified: Secondary | ICD-10-CM | POA: Diagnosis not present

## 2019-05-20 DIAGNOSIS — J45909 Unspecified asthma, uncomplicated: Secondary | ICD-10-CM | POA: Diagnosis not present

## 2019-05-25 DIAGNOSIS — F250 Schizoaffective disorder, bipolar type: Secondary | ICD-10-CM | POA: Diagnosis not present

## 2019-06-08 DIAGNOSIS — I10 Essential (primary) hypertension: Secondary | ICD-10-CM | POA: Diagnosis not present

## 2019-06-08 DIAGNOSIS — E539 Vitamin B deficiency, unspecified: Secondary | ICD-10-CM | POA: Diagnosis not present

## 2019-06-08 DIAGNOSIS — N400 Enlarged prostate without lower urinary tract symptoms: Secondary | ICD-10-CM | POA: Diagnosis not present

## 2019-06-08 DIAGNOSIS — E559 Vitamin D deficiency, unspecified: Secondary | ICD-10-CM | POA: Diagnosis not present

## 2019-06-08 DIAGNOSIS — E119 Type 2 diabetes mellitus without complications: Secondary | ICD-10-CM | POA: Diagnosis not present

## 2019-06-08 DIAGNOSIS — E785 Hyperlipidemia, unspecified: Secondary | ICD-10-CM | POA: Diagnosis not present

## 2019-06-08 DIAGNOSIS — B1710 Acute hepatitis C without hepatic coma: Secondary | ICD-10-CM | POA: Diagnosis not present

## 2019-07-20 DIAGNOSIS — I10 Essential (primary) hypertension: Secondary | ICD-10-CM | POA: Diagnosis not present

## 2019-07-20 DIAGNOSIS — J45909 Unspecified asthma, uncomplicated: Secondary | ICD-10-CM | POA: Diagnosis not present

## 2019-07-20 DIAGNOSIS — G8929 Other chronic pain: Secondary | ICD-10-CM | POA: Diagnosis not present

## 2019-07-20 DIAGNOSIS — K219 Gastro-esophageal reflux disease without esophagitis: Secondary | ICD-10-CM | POA: Diagnosis not present

## 2019-08-19 DIAGNOSIS — K219 Gastro-esophageal reflux disease without esophagitis: Secondary | ICD-10-CM | POA: Diagnosis not present

## 2019-08-19 DIAGNOSIS — I10 Essential (primary) hypertension: Secondary | ICD-10-CM | POA: Diagnosis not present

## 2019-08-19 DIAGNOSIS — J45909 Unspecified asthma, uncomplicated: Secondary | ICD-10-CM | POA: Diagnosis not present

## 2019-08-19 DIAGNOSIS — F209 Schizophrenia, unspecified: Secondary | ICD-10-CM | POA: Diagnosis not present

## 2019-09-13 DIAGNOSIS — J45901 Unspecified asthma with (acute) exacerbation: Secondary | ICD-10-CM | POA: Diagnosis not present

## 2019-09-13 DIAGNOSIS — I10 Essential (primary) hypertension: Secondary | ICD-10-CM | POA: Diagnosis not present

## 2019-09-13 DIAGNOSIS — K219 Gastro-esophageal reflux disease without esophagitis: Secondary | ICD-10-CM | POA: Diagnosis not present

## 2019-09-13 DIAGNOSIS — J45909 Unspecified asthma, uncomplicated: Secondary | ICD-10-CM | POA: Diagnosis not present

## 2019-10-25 DIAGNOSIS — I10 Essential (primary) hypertension: Secondary | ICD-10-CM | POA: Diagnosis not present

## 2019-10-25 DIAGNOSIS — K219 Gastro-esophageal reflux disease without esophagitis: Secondary | ICD-10-CM | POA: Diagnosis not present

## 2019-10-25 DIAGNOSIS — J45909 Unspecified asthma, uncomplicated: Secondary | ICD-10-CM | POA: Diagnosis not present

## 2019-10-25 DIAGNOSIS — F209 Schizophrenia, unspecified: Secondary | ICD-10-CM | POA: Diagnosis not present

## 2019-11-22 DIAGNOSIS — I10 Essential (primary) hypertension: Secondary | ICD-10-CM | POA: Diagnosis not present

## 2019-11-22 DIAGNOSIS — K219 Gastro-esophageal reflux disease without esophagitis: Secondary | ICD-10-CM | POA: Diagnosis not present

## 2019-11-22 DIAGNOSIS — J45909 Unspecified asthma, uncomplicated: Secondary | ICD-10-CM | POA: Diagnosis not present

## 2019-11-22 DIAGNOSIS — G8929 Other chronic pain: Secondary | ICD-10-CM | POA: Diagnosis not present

## 2019-12-20 DIAGNOSIS — J45909 Unspecified asthma, uncomplicated: Secondary | ICD-10-CM | POA: Diagnosis not present

## 2019-12-20 DIAGNOSIS — I10 Essential (primary) hypertension: Secondary | ICD-10-CM | POA: Diagnosis not present

## 2020-01-17 DIAGNOSIS — I10 Essential (primary) hypertension: Secondary | ICD-10-CM | POA: Diagnosis not present

## 2020-01-17 DIAGNOSIS — R251 Tremor, unspecified: Secondary | ICD-10-CM | POA: Diagnosis not present

## 2020-01-17 DIAGNOSIS — F209 Schizophrenia, unspecified: Secondary | ICD-10-CM | POA: Diagnosis not present

## 2020-01-17 DIAGNOSIS — J45909 Unspecified asthma, uncomplicated: Secondary | ICD-10-CM | POA: Diagnosis not present

## 2020-02-14 DIAGNOSIS — F209 Schizophrenia, unspecified: Secondary | ICD-10-CM | POA: Diagnosis not present

## 2020-02-14 DIAGNOSIS — J45909 Unspecified asthma, uncomplicated: Secondary | ICD-10-CM | POA: Diagnosis not present

## 2020-02-14 DIAGNOSIS — K219 Gastro-esophageal reflux disease without esophagitis: Secondary | ICD-10-CM | POA: Diagnosis not present

## 2020-02-14 DIAGNOSIS — I10 Essential (primary) hypertension: Secondary | ICD-10-CM | POA: Diagnosis not present

## 2020-02-24 DIAGNOSIS — F250 Schizoaffective disorder, bipolar type: Secondary | ICD-10-CM | POA: Diagnosis not present

## 2020-03-13 DIAGNOSIS — I10 Essential (primary) hypertension: Secondary | ICD-10-CM | POA: Diagnosis not present

## 2020-03-13 DIAGNOSIS — J45909 Unspecified asthma, uncomplicated: Secondary | ICD-10-CM | POA: Diagnosis not present

## 2020-03-13 DIAGNOSIS — F209 Schizophrenia, unspecified: Secondary | ICD-10-CM | POA: Diagnosis not present

## 2020-03-13 DIAGNOSIS — R251 Tremor, unspecified: Secondary | ICD-10-CM | POA: Diagnosis not present

## 2020-03-24 DIAGNOSIS — F250 Schizoaffective disorder, bipolar type: Secondary | ICD-10-CM | POA: Diagnosis not present

## 2020-04-10 DIAGNOSIS — K219 Gastro-esophageal reflux disease without esophagitis: Secondary | ICD-10-CM | POA: Diagnosis not present

## 2020-04-10 DIAGNOSIS — I10 Essential (primary) hypertension: Secondary | ICD-10-CM | POA: Diagnosis not present

## 2020-04-10 DIAGNOSIS — R05 Cough: Secondary | ICD-10-CM | POA: Diagnosis not present

## 2020-04-10 DIAGNOSIS — J45901 Unspecified asthma with (acute) exacerbation: Secondary | ICD-10-CM | POA: Diagnosis not present

## 2020-05-09 DIAGNOSIS — L609 Nail disorder, unspecified: Secondary | ICD-10-CM | POA: Diagnosis not present

## 2020-05-09 DIAGNOSIS — J45909 Unspecified asthma, uncomplicated: Secondary | ICD-10-CM | POA: Diagnosis not present

## 2020-05-09 DIAGNOSIS — R251 Tremor, unspecified: Secondary | ICD-10-CM | POA: Diagnosis not present

## 2020-05-09 DIAGNOSIS — I10 Essential (primary) hypertension: Secondary | ICD-10-CM | POA: Diagnosis not present

## 2020-06-05 DIAGNOSIS — F209 Schizophrenia, unspecified: Secondary | ICD-10-CM | POA: Diagnosis not present

## 2020-06-05 DIAGNOSIS — J45909 Unspecified asthma, uncomplicated: Secondary | ICD-10-CM | POA: Diagnosis not present

## 2020-06-05 DIAGNOSIS — I10 Essential (primary) hypertension: Secondary | ICD-10-CM | POA: Diagnosis not present

## 2020-06-05 DIAGNOSIS — K219 Gastro-esophageal reflux disease without esophagitis: Secondary | ICD-10-CM | POA: Diagnosis not present

## 2020-06-06 DIAGNOSIS — Z79899 Other long term (current) drug therapy: Secondary | ICD-10-CM | POA: Diagnosis not present

## 2020-06-06 DIAGNOSIS — I10 Essential (primary) hypertension: Secondary | ICD-10-CM | POA: Diagnosis not present

## 2020-06-06 DIAGNOSIS — E559 Vitamin D deficiency, unspecified: Secondary | ICD-10-CM | POA: Diagnosis not present

## 2020-06-06 DIAGNOSIS — E119 Type 2 diabetes mellitus without complications: Secondary | ICD-10-CM | POA: Diagnosis not present

## 2020-06-06 DIAGNOSIS — E785 Hyperlipidemia, unspecified: Secondary | ICD-10-CM | POA: Diagnosis not present

## 2020-08-11 DIAGNOSIS — G2401 Drug induced subacute dyskinesia: Secondary | ICD-10-CM | POA: Diagnosis not present

## 2020-08-11 DIAGNOSIS — F259 Schizoaffective disorder, unspecified: Secondary | ICD-10-CM | POA: Diagnosis not present

## 2020-08-11 DIAGNOSIS — J45909 Unspecified asthma, uncomplicated: Secondary | ICD-10-CM | POA: Diagnosis not present

## 2020-08-11 DIAGNOSIS — K219 Gastro-esophageal reflux disease without esophagitis: Secondary | ICD-10-CM | POA: Diagnosis not present

## 2020-08-11 DIAGNOSIS — I10 Essential (primary) hypertension: Secondary | ICD-10-CM | POA: Diagnosis not present

## 2020-08-30 DIAGNOSIS — E119 Type 2 diabetes mellitus without complications: Secondary | ICD-10-CM | POA: Diagnosis not present

## 2020-08-30 DIAGNOSIS — E039 Hypothyroidism, unspecified: Secondary | ICD-10-CM | POA: Diagnosis not present

## 2020-08-30 DIAGNOSIS — I10 Essential (primary) hypertension: Secondary | ICD-10-CM | POA: Diagnosis not present

## 2020-08-30 DIAGNOSIS — E782 Mixed hyperlipidemia: Secondary | ICD-10-CM | POA: Diagnosis not present

## 2020-09-15 DIAGNOSIS — J45909 Unspecified asthma, uncomplicated: Secondary | ICD-10-CM | POA: Diagnosis not present

## 2020-09-15 DIAGNOSIS — I10 Essential (primary) hypertension: Secondary | ICD-10-CM | POA: Diagnosis not present

## 2020-09-15 DIAGNOSIS — K219 Gastro-esophageal reflux disease without esophagitis: Secondary | ICD-10-CM | POA: Diagnosis not present

## 2020-11-27 DIAGNOSIS — K219 Gastro-esophageal reflux disease without esophagitis: Secondary | ICD-10-CM | POA: Diagnosis not present

## 2020-11-27 DIAGNOSIS — Z79899 Other long term (current) drug therapy: Secondary | ICD-10-CM | POA: Diagnosis not present

## 2020-11-27 DIAGNOSIS — I10 Essential (primary) hypertension: Secondary | ICD-10-CM | POA: Diagnosis not present

## 2020-11-27 DIAGNOSIS — J45909 Unspecified asthma, uncomplicated: Secondary | ICD-10-CM | POA: Diagnosis not present

## 2020-11-29 DIAGNOSIS — Z79899 Other long term (current) drug therapy: Secondary | ICD-10-CM | POA: Diagnosis not present

## 2020-12-19 DIAGNOSIS — E559 Vitamin D deficiency, unspecified: Secondary | ICD-10-CM | POA: Diagnosis not present

## 2021-01-26 DIAGNOSIS — F209 Schizophrenia, unspecified: Secondary | ICD-10-CM | POA: Diagnosis not present

## 2021-01-26 DIAGNOSIS — R4189 Other symptoms and signs involving cognitive functions and awareness: Secondary | ICD-10-CM | POA: Diagnosis not present

## 2021-01-26 DIAGNOSIS — F819 Developmental disorder of scholastic skills, unspecified: Secondary | ICD-10-CM | POA: Diagnosis not present

## 2021-02-22 DIAGNOSIS — F209 Schizophrenia, unspecified: Secondary | ICD-10-CM | POA: Diagnosis not present

## 2021-02-22 DIAGNOSIS — F819 Developmental disorder of scholastic skills, unspecified: Secondary | ICD-10-CM | POA: Diagnosis not present

## 2021-02-22 DIAGNOSIS — R4189 Other symptoms and signs involving cognitive functions and awareness: Secondary | ICD-10-CM | POA: Diagnosis not present

## 2021-03-30 DIAGNOSIS — F819 Developmental disorder of scholastic skills, unspecified: Secondary | ICD-10-CM | POA: Diagnosis not present

## 2021-03-30 DIAGNOSIS — R4189 Other symptoms and signs involving cognitive functions and awareness: Secondary | ICD-10-CM | POA: Diagnosis not present

## 2021-03-30 DIAGNOSIS — F209 Schizophrenia, unspecified: Secondary | ICD-10-CM | POA: Diagnosis not present

## 2021-04-13 DIAGNOSIS — Z79899 Other long term (current) drug therapy: Secondary | ICD-10-CM | POA: Diagnosis not present

## 2021-04-23 DIAGNOSIS — F209 Schizophrenia, unspecified: Secondary | ICD-10-CM | POA: Diagnosis not present

## 2021-04-23 DIAGNOSIS — R4189 Other symptoms and signs involving cognitive functions and awareness: Secondary | ICD-10-CM | POA: Diagnosis not present

## 2021-04-23 DIAGNOSIS — F819 Developmental disorder of scholastic skills, unspecified: Secondary | ICD-10-CM | POA: Diagnosis not present

## 2021-05-30 DIAGNOSIS — M19079 Primary osteoarthritis, unspecified ankle and foot: Secondary | ICD-10-CM | POA: Diagnosis not present

## 2021-05-30 DIAGNOSIS — R269 Unspecified abnormalities of gait and mobility: Secondary | ICD-10-CM | POA: Diagnosis not present

## 2021-05-30 DIAGNOSIS — M79675 Pain in left toe(s): Secondary | ICD-10-CM | POA: Diagnosis not present

## 2021-05-30 DIAGNOSIS — B351 Tinea unguium: Secondary | ICD-10-CM | POA: Diagnosis not present

## 2021-05-30 DIAGNOSIS — L603 Nail dystrophy: Secondary | ICD-10-CM | POA: Diagnosis not present

## 2021-05-31 DIAGNOSIS — R4189 Other symptoms and signs involving cognitive functions and awareness: Secondary | ICD-10-CM | POA: Diagnosis not present

## 2021-05-31 DIAGNOSIS — F819 Developmental disorder of scholastic skills, unspecified: Secondary | ICD-10-CM | POA: Diagnosis not present

## 2021-05-31 DIAGNOSIS — F209 Schizophrenia, unspecified: Secondary | ICD-10-CM | POA: Diagnosis not present

## 2021-06-07 DIAGNOSIS — K219 Gastro-esophageal reflux disease without esophagitis: Secondary | ICD-10-CM | POA: Diagnosis not present

## 2021-06-07 DIAGNOSIS — I10 Essential (primary) hypertension: Secondary | ICD-10-CM | POA: Diagnosis not present

## 2021-06-07 DIAGNOSIS — F319 Bipolar disorder, unspecified: Secondary | ICD-10-CM | POA: Diagnosis not present

## 2021-06-07 DIAGNOSIS — F209 Schizophrenia, unspecified: Secondary | ICD-10-CM | POA: Diagnosis not present

## 2021-07-31 DIAGNOSIS — F819 Developmental disorder of scholastic skills, unspecified: Secondary | ICD-10-CM | POA: Diagnosis not present

## 2021-07-31 DIAGNOSIS — F209 Schizophrenia, unspecified: Secondary | ICD-10-CM | POA: Diagnosis not present

## 2021-08-02 DIAGNOSIS — L851 Acquired keratosis [keratoderma] palmaris et plantaris: Secondary | ICD-10-CM | POA: Diagnosis not present

## 2021-08-02 DIAGNOSIS — M79674 Pain in right toe(s): Secondary | ICD-10-CM | POA: Diagnosis not present

## 2021-08-02 DIAGNOSIS — L603 Nail dystrophy: Secondary | ICD-10-CM | POA: Diagnosis not present

## 2021-08-02 DIAGNOSIS — R2681 Unsteadiness on feet: Secondary | ICD-10-CM | POA: Diagnosis not present

## 2021-08-02 DIAGNOSIS — B351 Tinea unguium: Secondary | ICD-10-CM | POA: Diagnosis not present

## 2021-08-02 DIAGNOSIS — G40909 Epilepsy, unspecified, not intractable, without status epilepticus: Secondary | ICD-10-CM | POA: Diagnosis not present

## 2021-08-09 DIAGNOSIS — F209 Schizophrenia, unspecified: Secondary | ICD-10-CM | POA: Diagnosis not present

## 2021-08-09 DIAGNOSIS — F819 Developmental disorder of scholastic skills, unspecified: Secondary | ICD-10-CM | POA: Diagnosis not present

## 2021-08-09 DIAGNOSIS — R4189 Other symptoms and signs involving cognitive functions and awareness: Secondary | ICD-10-CM | POA: Diagnosis not present

## 2021-08-16 DIAGNOSIS — F209 Schizophrenia, unspecified: Secondary | ICD-10-CM | POA: Diagnosis not present

## 2021-08-16 DIAGNOSIS — I10 Essential (primary) hypertension: Secondary | ICD-10-CM | POA: Diagnosis not present

## 2021-08-16 DIAGNOSIS — Z1329 Encounter for screening for other suspected endocrine disorder: Secondary | ICD-10-CM | POA: Diagnosis not present

## 2021-08-16 DIAGNOSIS — E559 Vitamin D deficiency, unspecified: Secondary | ICD-10-CM | POA: Diagnosis not present

## 2021-08-16 DIAGNOSIS — J4540 Moderate persistent asthma, uncomplicated: Secondary | ICD-10-CM | POA: Diagnosis not present

## 2021-08-16 DIAGNOSIS — Z131 Encounter for screening for diabetes mellitus: Secondary | ICD-10-CM | POA: Diagnosis not present

## 2021-08-16 DIAGNOSIS — Z79899 Other long term (current) drug therapy: Secondary | ICD-10-CM | POA: Diagnosis not present

## 2021-08-16 DIAGNOSIS — Z13220 Encounter for screening for lipoid disorders: Secondary | ICD-10-CM | POA: Diagnosis not present

## 2021-08-16 DIAGNOSIS — F319 Bipolar disorder, unspecified: Secondary | ICD-10-CM | POA: Diagnosis not present

## 2021-08-16 DIAGNOSIS — K219 Gastro-esophageal reflux disease without esophagitis: Secondary | ICD-10-CM | POA: Diagnosis not present

## 2021-09-20 DIAGNOSIS — I10 Essential (primary) hypertension: Secondary | ICD-10-CM | POA: Diagnosis not present

## 2021-09-20 DIAGNOSIS — F319 Bipolar disorder, unspecified: Secondary | ICD-10-CM | POA: Diagnosis not present

## 2021-09-20 DIAGNOSIS — J45909 Unspecified asthma, uncomplicated: Secondary | ICD-10-CM | POA: Diagnosis not present

## 2021-09-20 DIAGNOSIS — F259 Schizoaffective disorder, unspecified: Secondary | ICD-10-CM | POA: Diagnosis not present

## 2021-09-20 DIAGNOSIS — F209 Schizophrenia, unspecified: Secondary | ICD-10-CM | POA: Diagnosis not present

## 2021-09-20 DIAGNOSIS — K219 Gastro-esophageal reflux disease without esophagitis: Secondary | ICD-10-CM | POA: Diagnosis not present

## 2021-09-20 DIAGNOSIS — F99 Mental disorder, not otherwise specified: Secondary | ICD-10-CM | POA: Diagnosis not present

## 2021-09-24 DIAGNOSIS — E559 Vitamin D deficiency, unspecified: Secondary | ICD-10-CM | POA: Diagnosis not present

## 2021-09-24 DIAGNOSIS — F319 Bipolar disorder, unspecified: Secondary | ICD-10-CM | POA: Diagnosis not present

## 2021-09-24 DIAGNOSIS — F209 Schizophrenia, unspecified: Secondary | ICD-10-CM | POA: Diagnosis not present

## 2021-09-24 DIAGNOSIS — I10 Essential (primary) hypertension: Secondary | ICD-10-CM | POA: Diagnosis not present

## 2021-10-10 DIAGNOSIS — R2681 Unsteadiness on feet: Secondary | ICD-10-CM | POA: Diagnosis not present

## 2021-10-10 DIAGNOSIS — L84 Corns and callosities: Secondary | ICD-10-CM | POA: Diagnosis not present

## 2021-10-10 DIAGNOSIS — L605 Yellow nail syndrome: Secondary | ICD-10-CM | POA: Diagnosis not present

## 2021-10-10 DIAGNOSIS — B351 Tinea unguium: Secondary | ICD-10-CM | POA: Diagnosis not present

## 2021-10-10 DIAGNOSIS — G40909 Epilepsy, unspecified, not intractable, without status epilepticus: Secondary | ICD-10-CM | POA: Diagnosis not present

## 2021-10-10 DIAGNOSIS — M79674 Pain in right toe(s): Secondary | ICD-10-CM | POA: Diagnosis not present

## 2021-10-25 DIAGNOSIS — J4540 Moderate persistent asthma, uncomplicated: Secondary | ICD-10-CM | POA: Diagnosis not present

## 2021-10-25 DIAGNOSIS — Z0001 Encounter for general adult medical examination with abnormal findings: Secondary | ICD-10-CM | POA: Diagnosis not present

## 2021-10-25 DIAGNOSIS — E559 Vitamin D deficiency, unspecified: Secondary | ICD-10-CM | POA: Diagnosis not present

## 2021-10-25 DIAGNOSIS — Z79899 Other long term (current) drug therapy: Secondary | ICD-10-CM | POA: Diagnosis not present

## 2021-10-25 DIAGNOSIS — F319 Bipolar disorder, unspecified: Secondary | ICD-10-CM | POA: Diagnosis not present

## 2021-10-25 DIAGNOSIS — F209 Schizophrenia, unspecified: Secondary | ICD-10-CM | POA: Diagnosis not present

## 2021-10-25 DIAGNOSIS — F99 Mental disorder, not otherwise specified: Secondary | ICD-10-CM | POA: Diagnosis not present

## 2021-10-25 DIAGNOSIS — K219 Gastro-esophageal reflux disease without esophagitis: Secondary | ICD-10-CM | POA: Diagnosis not present

## 2021-10-25 DIAGNOSIS — I10 Essential (primary) hypertension: Secondary | ICD-10-CM | POA: Diagnosis not present

## 2021-12-06 ENCOUNTER — Emergency Department: Payer: PPO

## 2021-12-06 ENCOUNTER — Inpatient Hospital Stay
Admission: EM | Admit: 2021-12-06 | Discharge: 2021-12-12 | DRG: 493 | Disposition: A | Discharge status: Skilled Nursing Facility | Payer: PPO | Attending: Orthopedic Surgery | Admitting: Orthopedic Surgery

## 2021-12-06 ENCOUNTER — Other Ambulatory Visit: Payer: Self-pay

## 2021-12-06 DIAGNOSIS — Z888 Allergy status to other drugs, medicaments and biological substances status: Secondary | ICD-10-CM

## 2021-12-06 DIAGNOSIS — Z87891 Personal history of nicotine dependence: Secondary | ICD-10-CM | POA: Diagnosis not present

## 2021-12-06 DIAGNOSIS — Z79899 Other long term (current) drug therapy: Secondary | ICD-10-CM | POA: Diagnosis not present

## 2021-12-06 DIAGNOSIS — R278 Other lack of coordination: Secondary | ICD-10-CM | POA: Diagnosis not present

## 2021-12-06 DIAGNOSIS — X501XXA Overexertion from prolonged static or awkward postures, initial encounter: Secondary | ICD-10-CM | POA: Diagnosis not present

## 2021-12-06 DIAGNOSIS — Z881 Allergy status to other antibiotic agents status: Secondary | ICD-10-CM

## 2021-12-06 DIAGNOSIS — F209 Schizophrenia, unspecified: Secondary | ICD-10-CM | POA: Diagnosis not present

## 2021-12-06 DIAGNOSIS — R7309 Other abnormal glucose: Secondary | ICD-10-CM | POA: Diagnosis present

## 2021-12-06 DIAGNOSIS — S82851A Displaced trimalleolar fracture of right lower leg, initial encounter for closed fracture: Secondary | ICD-10-CM

## 2021-12-06 DIAGNOSIS — S82891A Other fracture of right lower leg, initial encounter for closed fracture: Secondary | ICD-10-CM | POA: Diagnosis not present

## 2021-12-06 DIAGNOSIS — R7301 Impaired fasting glucose: Secondary | ICD-10-CM | POA: Diagnosis present

## 2021-12-06 DIAGNOSIS — J45998 Other asthma: Secondary | ICD-10-CM | POA: Diagnosis not present

## 2021-12-06 DIAGNOSIS — E559 Vitamin D deficiency, unspecified: Secondary | ICD-10-CM | POA: Diagnosis not present

## 2021-12-06 DIAGNOSIS — R2681 Unsteadiness on feet: Secondary | ICD-10-CM | POA: Diagnosis not present

## 2021-12-06 DIAGNOSIS — E860 Dehydration: Secondary | ICD-10-CM | POA: Diagnosis present

## 2021-12-06 DIAGNOSIS — R062 Wheezing: Secondary | ICD-10-CM | POA: Diagnosis not present

## 2021-12-06 DIAGNOSIS — Z9889 Other specified postprocedural states: Secondary | ICD-10-CM | POA: Diagnosis not present

## 2021-12-06 DIAGNOSIS — Z6832 Body mass index (BMI) 32.0-32.9, adult: Secondary | ICD-10-CM | POA: Diagnosis not present

## 2021-12-06 DIAGNOSIS — R531 Weakness: Secondary | ICD-10-CM | POA: Diagnosis not present

## 2021-12-06 DIAGNOSIS — R0602 Shortness of breath: Secondary | ICD-10-CM | POA: Diagnosis not present

## 2021-12-06 DIAGNOSIS — E161 Other hypoglycemia: Secondary | ICD-10-CM | POA: Diagnosis not present

## 2021-12-06 DIAGNOSIS — S82899A Other fracture of unspecified lower leg, initial encounter for closed fracture: Secondary | ICD-10-CM

## 2021-12-06 DIAGNOSIS — S30813A Abrasion of scrotum and testes, initial encounter: Secondary | ICD-10-CM | POA: Diagnosis not present

## 2021-12-06 DIAGNOSIS — Z4789 Encounter for other orthopedic aftercare: Secondary | ICD-10-CM | POA: Diagnosis not present

## 2021-12-06 DIAGNOSIS — E669 Obesity, unspecified: Secondary | ICD-10-CM | POA: Diagnosis present

## 2021-12-06 DIAGNOSIS — Z01818 Encounter for other preprocedural examination: Secondary | ICD-10-CM | POA: Diagnosis not present

## 2021-12-06 DIAGNOSIS — R7303 Prediabetes: Secondary | ICD-10-CM | POA: Diagnosis present

## 2021-12-06 DIAGNOSIS — F200 Paranoid schizophrenia: Secondary | ICD-10-CM | POA: Diagnosis present

## 2021-12-06 DIAGNOSIS — W19XXXA Unspecified fall, initial encounter: Secondary | ICD-10-CM

## 2021-12-06 DIAGNOSIS — F32A Depression, unspecified: Secondary | ICD-10-CM | POA: Diagnosis present

## 2021-12-06 DIAGNOSIS — J45909 Unspecified asthma, uncomplicated: Secondary | ICD-10-CM | POA: Diagnosis present

## 2021-12-06 DIAGNOSIS — S82831A Other fracture of upper and lower end of right fibula, initial encounter for closed fracture: Secondary | ICD-10-CM | POA: Diagnosis not present

## 2021-12-06 DIAGNOSIS — S8251XA Displaced fracture of medial malleolus of right tibia, initial encounter for closed fracture: Secondary | ICD-10-CM | POA: Diagnosis not present

## 2021-12-06 DIAGNOSIS — J4520 Mild intermittent asthma, uncomplicated: Secondary | ICD-10-CM | POA: Diagnosis not present

## 2021-12-06 DIAGNOSIS — W19XXXD Unspecified fall, subsequent encounter: Secondary | ICD-10-CM | POA: Diagnosis not present

## 2021-12-06 DIAGNOSIS — Y92007 Garden or yard of unspecified non-institutional (private) residence as the place of occurrence of the external cause: Secondary | ICD-10-CM

## 2021-12-06 DIAGNOSIS — F319 Bipolar disorder, unspecified: Secondary | ICD-10-CM | POA: Diagnosis not present

## 2021-12-06 DIAGNOSIS — Z7401 Bed confinement status: Secondary | ICD-10-CM | POA: Diagnosis not present

## 2021-12-06 DIAGNOSIS — R928 Other abnormal and inconclusive findings on diagnostic imaging of breast: Secondary | ICD-10-CM | POA: Diagnosis not present

## 2021-12-06 DIAGNOSIS — S82851S Displaced trimalleolar fracture of right lower leg, sequela: Secondary | ICD-10-CM | POA: Diagnosis not present

## 2021-12-06 DIAGNOSIS — K219 Gastro-esophageal reflux disease without esophagitis: Secondary | ICD-10-CM | POA: Diagnosis not present

## 2021-12-06 DIAGNOSIS — W010XXA Fall on same level from slipping, tripping and stumbling without subsequent striking against object, initial encounter: Secondary | ICD-10-CM | POA: Diagnosis present

## 2021-12-06 DIAGNOSIS — R1319 Other dysphagia: Secondary | ICD-10-CM | POA: Diagnosis not present

## 2021-12-06 DIAGNOSIS — S82841A Displaced bimalleolar fracture of right lower leg, initial encounter for closed fracture: Secondary | ICD-10-CM | POA: Diagnosis not present

## 2021-12-06 DIAGNOSIS — R197 Diarrhea, unspecified: Secondary | ICD-10-CM | POA: Diagnosis not present

## 2021-12-06 DIAGNOSIS — R937 Abnormal findings on diagnostic imaging of other parts of musculoskeletal system: Secondary | ICD-10-CM | POA: Diagnosis not present

## 2021-12-06 DIAGNOSIS — S82391A Other fracture of lower end of right tibia, initial encounter for closed fracture: Secondary | ICD-10-CM | POA: Diagnosis not present

## 2021-12-06 DIAGNOSIS — Z20822 Contact with and (suspected) exposure to covid-19: Secondary | ICD-10-CM | POA: Diagnosis present

## 2021-12-06 DIAGNOSIS — R5381 Other malaise: Secondary | ICD-10-CM | POA: Diagnosis not present

## 2021-12-06 DIAGNOSIS — S8261XA Displaced fracture of lateral malleolus of right fibula, initial encounter for closed fracture: Secondary | ICD-10-CM | POA: Diagnosis not present

## 2021-12-06 DIAGNOSIS — K5900 Constipation, unspecified: Secondary | ICD-10-CM | POA: Diagnosis not present

## 2021-12-06 DIAGNOSIS — R609 Edema, unspecified: Secondary | ICD-10-CM | POA: Diagnosis not present

## 2021-12-06 DIAGNOSIS — R141 Gas pain: Secondary | ICD-10-CM | POA: Diagnosis not present

## 2021-12-06 DIAGNOSIS — M6259 Muscle wasting and atrophy, not elsewhere classified, multiple sites: Secondary | ICD-10-CM | POA: Diagnosis not present

## 2021-12-06 DIAGNOSIS — Y9301 Activity, walking, marching and hiking: Secondary | ICD-10-CM | POA: Diagnosis present

## 2021-12-06 DIAGNOSIS — E569 Vitamin deficiency, unspecified: Secondary | ICD-10-CM | POA: Diagnosis not present

## 2021-12-06 DIAGNOSIS — I10 Essential (primary) hypertension: Secondary | ICD-10-CM | POA: Diagnosis present

## 2021-12-06 DIAGNOSIS — R41841 Cognitive communication deficit: Secondary | ICD-10-CM | POA: Diagnosis not present

## 2021-12-06 DIAGNOSIS — M7989 Other specified soft tissue disorders: Secondary | ICD-10-CM | POA: Diagnosis not present

## 2021-12-06 DIAGNOSIS — S82851D Displaced trimalleolar fracture of right lower leg, subsequent encounter for closed fracture with routine healing: Secondary | ICD-10-CM | POA: Diagnosis not present

## 2021-12-06 DIAGNOSIS — F99 Mental disorder, not otherwise specified: Secondary | ICD-10-CM | POA: Diagnosis not present

## 2021-12-06 DIAGNOSIS — T148XXA Other injury of unspecified body region, initial encounter: Secondary | ICD-10-CM

## 2021-12-06 DIAGNOSIS — M6281 Muscle weakness (generalized): Secondary | ICD-10-CM | POA: Diagnosis not present

## 2021-12-06 DIAGNOSIS — J4540 Moderate persistent asthma, uncomplicated: Secondary | ICD-10-CM | POA: Diagnosis not present

## 2021-12-06 DIAGNOSIS — M25571 Pain in right ankle and joints of right foot: Secondary | ICD-10-CM | POA: Diagnosis not present

## 2021-12-06 LAB — CBC WITH DIFFERENTIAL/PLATELET
Abs Immature Granulocytes: 0.04 10*3/uL (ref 0.00–0.07)
Basophils Absolute: 0 10*3/uL (ref 0.0–0.1)
Basophils Relative: 0 %
Eosinophils Absolute: 0 10*3/uL (ref 0.0–0.5)
Eosinophils Relative: 0 %
HCT: 46.1 % (ref 39.0–52.0)
Hemoglobin: 15.4 g/dL (ref 13.0–17.0)
Immature Granulocytes: 0 %
Lymphocytes Relative: 5 %
Lymphs Abs: 0.6 10*3/uL — ABNORMAL LOW (ref 0.7–4.0)
MCH: 32.4 pg (ref 26.0–34.0)
MCHC: 33.4 g/dL (ref 30.0–36.0)
MCV: 96.8 fL (ref 80.0–100.0)
Monocytes Absolute: 0.7 10*3/uL (ref 0.1–1.0)
Monocytes Relative: 5 %
Neutro Abs: 11.9 10*3/uL — ABNORMAL HIGH (ref 1.7–7.7)
Neutrophils Relative %: 90 %
Platelets: 114 10*3/uL — ABNORMAL LOW (ref 150–400)
RBC: 4.76 MIL/uL (ref 4.22–5.81)
RDW: 13.1 % (ref 11.5–15.5)
WBC: 13.3 10*3/uL — ABNORMAL HIGH (ref 4.0–10.5)
nRBC: 0 % (ref 0.0–0.2)

## 2021-12-06 LAB — RESP PANEL BY RT-PCR (FLU A&B, COVID) ARPGX2
Influenza A by PCR: NEGATIVE
Influenza B by PCR: NEGATIVE
SARS Coronavirus 2 by RT PCR: NEGATIVE

## 2021-12-06 LAB — BASIC METABOLIC PANEL
Anion gap: 7 (ref 5–15)
BUN: 11 mg/dL (ref 6–20)
CO2: 27 mmol/L (ref 22–32)
Calcium: 8.4 mg/dL — ABNORMAL LOW (ref 8.9–10.3)
Chloride: 104 mmol/L (ref 98–111)
Creatinine, Ser: 1.1 mg/dL (ref 0.61–1.24)
GFR, Estimated: >60 mL/min (ref >60)
Glucose, Bld: 138 mg/dL — ABNORMAL HIGH (ref 70–99)
Potassium: 4.1 mmol/L (ref 3.5–5.1)
Sodium: 138 mmol/L (ref 135–145)

## 2021-12-06 LAB — COMPREHENSIVE METABOLIC PANEL
ALT: 24 U/L (ref 0–44)
AST: 36 U/L (ref 15–41)
Albumin: 3.2 g/dL — ABNORMAL LOW (ref 3.5–5.0)
Alkaline Phosphatase: 48 U/L (ref 38–126)
Anion gap: 7 (ref 5–15)
BUN: 11 mg/dL (ref 6–20)
CO2: 27 mmol/L (ref 22–32)
Calcium: 8.6 mg/dL — ABNORMAL LOW (ref 8.9–10.3)
Chloride: 104 mmol/L (ref 98–111)
Creatinine, Ser: 1.09 mg/dL (ref 0.61–1.24)
GFR, Estimated: >60 mL/min (ref >60)
Glucose, Bld: 125 mg/dL — ABNORMAL HIGH (ref 70–99)
Potassium: 4.2 mmol/L (ref 3.5–5.1)
Sodium: 138 mmol/L (ref 135–145)
Total Bilirubin: 0.5 mg/dL (ref 0.3–1.2)
Total Protein: 6.5 g/dL (ref 6.5–8.1)

## 2021-12-06 LAB — HIV ANTIBODY (ROUTINE TESTING W REFLEX): HIV Screen 4th Generation wRfx: NONREACTIVE

## 2021-12-06 LAB — APTT: aPTT: 22 seconds — ABNORMAL LOW (ref 24–36)

## 2021-12-06 LAB — SURGICAL PCR SCREEN
MRSA, PCR: NEGATIVE
Staphylococcus aureus: POSITIVE — AB

## 2021-12-06 LAB — PROTIME-INR
INR: 1 (ref 0.8–1.2)
Prothrombin Time: 13.1 seconds (ref 11.4–15.2)

## 2021-12-06 MED ORDER — BENZTROPINE MESYLATE 1 MG PO TABS
1.0000 mg | ORAL_TABLET | Freq: Three times a day (TID) | ORAL | Status: DC
  Administered 2021-12-06 – 2021-12-12 (×16): 1 mg via ORAL
  Filled 2021-12-06 (×19): qty 1

## 2021-12-06 MED ORDER — PALIPERIDONE ER 3 MG PO TB24
6.0000 mg | ORAL_TABLET | Freq: Every day | ORAL | Status: DC
  Administered 2021-12-06 – 2021-12-11 (×6): 6 mg via ORAL
  Filled 2021-12-06 (×8): qty 2

## 2021-12-06 MED ORDER — DIVALPROEX SODIUM ER 500 MG PO TB24
2000.0000 mg | ORAL_TABLET | Freq: Every day | ORAL | Status: DC
  Administered 2021-12-06 – 2021-12-11 (×6): 2000 mg via ORAL
  Filled 2021-12-06 (×7): qty 4

## 2021-12-06 MED ORDER — PANTOPRAZOLE SODIUM 40 MG PO TBEC
40.0000 mg | DELAYED_RELEASE_TABLET | Freq: Every day | ORAL | Status: DC
  Administered 2021-12-07 – 2021-12-12 (×6): 40 mg via ORAL
  Filled 2021-12-06 (×6): qty 1

## 2021-12-06 MED ORDER — OXYCODONE HCL 5 MG PO TABS
5.0000 mg | ORAL_TABLET | ORAL | Status: DC | PRN
  Administered 2021-12-06: 10 mg via ORAL
  Administered 2021-12-09 – 2021-12-11 (×6): 5 mg via ORAL
  Administered 2021-12-12: 10 mg via ORAL
  Filled 2021-12-06 (×2): qty 1
  Filled 2021-12-06: qty 2
  Filled 2021-12-06: qty 1
  Filled 2021-12-06 (×2): qty 2
  Filled 2021-12-06: qty 1
  Filled 2021-12-06: qty 2
  Filled 2021-12-06 (×2): qty 1

## 2021-12-06 MED ORDER — SENNOSIDES-DOCUSATE SODIUM 8.6-50 MG PO TABS: 1.0000 | ORAL_TABLET | Freq: Every evening | ORAL | Status: DC | PRN

## 2021-12-06 MED ORDER — MORPHINE SULFATE (PF) 2 MG/ML IV SOLN
2.0000 mg | INTRAVENOUS | Status: DC | PRN
  Administered 2021-12-08 – 2021-12-09 (×2): 2 mg via INTRAVENOUS
  Filled 2021-12-06 (×2): qty 1

## 2021-12-06 MED ORDER — FLEET ENEMA 7-19 GM/118ML RE ENEM: 1.0000 | ENEMA | Freq: Once | RECTAL | Status: DC | PRN

## 2021-12-06 MED ORDER — VANCOMYCIN HCL IN DEXTROSE 1-5 GM/200ML-% IV SOLN
1000.0000 mg | Freq: Once | INTRAVENOUS | Status: DC
  Filled 2021-12-06 (×2): qty 200

## 2021-12-06 MED ORDER — DOCUSATE SODIUM 100 MG PO CAPS
100.0000 mg | ORAL_CAPSULE | Freq: Two times a day (BID) | ORAL | Status: DC
  Administered 2021-12-06 – 2021-12-12 (×12): 100 mg via ORAL
  Filled 2021-12-06 (×12): qty 1

## 2021-12-06 MED ORDER — BISACODYL 5 MG PO TBEC
5.0000 mg | DELAYED_RELEASE_TABLET | Freq: Every day | ORAL | Status: DC | PRN
  Administered 2021-12-09 – 2021-12-10 (×2): 5 mg via ORAL
  Filled 2021-12-06 (×3): qty 1

## 2021-12-06 MED ORDER — FLUTICASONE FUROATE-VILANTEROL 200-25 MCG/ACT IN AEPB
1.0000 | INHALATION_SPRAY | Freq: Every day | RESPIRATORY_TRACT | Status: DC
  Administered 2021-12-07 – 2021-12-12 (×6): 1 via RESPIRATORY_TRACT
  Filled 2021-12-06: qty 28

## 2021-12-06 MED ORDER — METOPROLOL SUCCINATE ER 50 MG PO TB24
50.0000 mg | ORAL_TABLET | Freq: Every day | ORAL | Status: DC
  Administered 2021-12-07 – 2021-12-11 (×5): 50 mg via ORAL
  Filled 2021-12-06 (×5): qty 1

## 2021-12-06 MED ORDER — METHOCARBAMOL 500 MG PO TABS
500.0000 mg | ORAL_TABLET | Freq: Four times a day (QID) | ORAL | Status: DC | PRN
  Administered 2021-12-09 – 2021-12-11 (×4): 500 mg via ORAL
  Filled 2021-12-06 (×5): qty 1

## 2021-12-06 MED ORDER — METHOCARBAMOL 1000 MG/10ML IJ SOLN
500.0000 mg | Freq: Four times a day (QID) | INTRAVENOUS | Status: DC | PRN
  Filled 2021-12-06: qty 5

## 2021-12-06 MED ORDER — SODIUM CHLORIDE 0.9 % IV SOLN: INTRAVENOUS | Status: DC

## 2021-12-06 MED ORDER — MUPIROCIN 2 % EX OINT
1.0000 "application " | TOPICAL_OINTMENT | Freq: Two times a day (BID) | CUTANEOUS | Status: AC
  Administered 2021-12-06 – 2021-12-11 (×10): 1 via NASAL
  Filled 2021-12-06: qty 22

## 2021-12-06 MED ORDER — ALBUTEROL SULFATE (2.5 MG/3ML) 0.083% IN NEBU: 3.0000 mL | INHALATION_SOLUTION | RESPIRATORY_TRACT | Status: DC | PRN

## 2021-12-06 NOTE — Assessment & Plan Note (Addendum)
Increase Toprol-XL to 100 mg daily.  With heart rate and blood pressure being a little high.

## 2021-12-06 NOTE — H&P (Signed)
PREOPERATIVE H&P  Chief Complaint: Right bimalleolar ankle fracture  HPI: [name] [name] is a 56 y.o. male who presented to the emergency department at [facility] today after a fall in his front yard when he slipped on mud.  He is brought into the ER by EMS.  Patient will be admitted to the orthopedic service for management of his right ankle fracture.  Patient denies other injuries.  Past Medical History:  Diagnosis Date   Acute bronchitis    Asthma    Depression    Hypertension    Schizophrenia (HCC)    History reviewed. No pertinent surgical history. Social History   Socioeconomic History   Marital status: Legally Separated    Spouse name: Not on file   Number of children: Not on file   Years of education: Not on file   Highest education level: Not on file  Occupational History   Not on file  Tobacco Use   Smoking status: Former   Smokeless tobacco: Not on file  Substance and Sexual Activity   Alcohol use: Not Currently   Drug use: Not Currently   Sexual activity: Not on file  Other Topics Concern   Not on file  Social History Narrative   Not on file   Social Determinants of Health   Financial Resource Strain: Not on file  Food Insecurity: Not on file  Transportation Needs: Not on file  Physical Activity: Not on file  Stress: Not on file  Social Connections: Not on file   History reviewed. No pertinent family history. Allergies  Allergen Reactions   Cefprozil    Cetirizine Hcl    Doxycycline    Prior to Admission medications   Not on File     Positive ROS: All other systems have been reviewed and were otherwise negative with the exception of those mentioned in the HPI and as above.  Physical Exam: General: Alert, no acute distress Cardiovascular: Regular rate and rhythm, no murmurs rubs or gallops.  No pedal edema Respiratory: Clear to auscultation bilaterally, no wheezes rales or rhonchi. No cyanosis, no use of accessory musculature GI: No  organomegaly, abdomen is soft and non-tender nondistended with positive bowel sounds. Skin: Skin intact, no lesions within the operative field. Neurologic: Sensation intact distally Psychiatric: Patient is competent for consent with normal mood and affect Lymphatic: No cervical lymphadenopathy  MUSCULOSKELETAL: Right ankle: Patient's ankle initially had a valgus deformity at the bedside in the ER.  His lower extremity is externally rotated creating the valgus deformity of the end of the bed.  Patient skin was intact.  He has palpable pedal pulses, intact station light touch and intact motor function distally.  His foot and leg compartments are soft and compressible.  Assessment: Right closed displaced bimalleolar ankle fracture  Plan: Plan for Procedure(s): Patient is being admitted to the orthopedic service for operative fixation of his right ankle fracture tomorrow.  I reviewed with the patient the nature of his fracture.  I commended to the patient that he undergo closed reduction and splinting in the ER today.  He will be admitted for pain control overnight and is scheduled to have surgical fixation of his right ankle fracture tomorrow in the OR.  I discussed the details of the operation as well as the postoperative course with the patient.  The hospital service is consulted for preoperative clearance.  Patient will be n.p.o. after midnight.  Patient should not have anticoagulation therapy and preparation for surgery tomorrow.  I reviewed the  patient's plain x-rays, CT scan and labs in preparation for this case.  I answered all the patient's questions.  He understood and agreed with this plan.  Procedure note: Patient had a gentle reduction of his ankle fracture with placement of an AO splint by me in the ER without sedation.  The valgus deformity was corrected to neutral.  patient tolerated the splinting procedure.   Juanell Fairly, MD   12/06/2021 6:20 PM

## 2021-12-06 NOTE — Assessment & Plan Note (Deleted)
Dr. Martha Clan to go take to the operating room for ankle repair surgery today.  No contraindications to surgery at this time.

## 2021-12-06 NOTE — ED Notes (Signed)
Lab called at this time to request someone come and draw ordered blood on this pt

## 2021-12-06 NOTE — Assessment & Plan Note (Deleted)
We will obtain a1c.

## 2021-12-06 NOTE — ED Triage Notes (Signed)
Pt from home via medic. Pt reports they were walking from taking out their trash, slipped in the mud and fell. Reports he felt his R ankle twist behind him and pop. Noted swelling, to R ankle, ankle in brace on presentation. MD Starleen Blue at bedside on presentation.

## 2021-12-06 NOTE — ED Provider Notes (Signed)
[facility] Provider Note    Event Date/Time   First MD Initiated Contact with Patient 12/06/21 1507     (approximate)   History   Ankle Pain   HPI  [name] [name] is a 56 y.o. male past medical history of hypertension, schizophrenia and asthma who presents with an ankle injury.  Patient was walking in his front yard when he slipped in mud and twisted his right ankle and heard a pop.  He was given 100 mg of fentanyl by EMS and pain is relatively controlled at this time.  Denies any numbness or tingling.  Denies hitting his head denies other injury.    Past Medical History:  Diagnosis Date   Acute bronchitis    Asthma    Depression    Hypertension    Schizophrenia [facility])     Patient Active Problem List   Diagnosis Date Noted   ACUTE BRONCHITIS 01/24/2009   SCHIZOPHRENIA, PARANOID, CHRONIC 04/27/2007   DEPRESSION 04/23/2007   HYPERTENSION 04/23/2007   ASTHMA 04/23/2007     Physical Exam  Triage Vital Signs: ED Triage Vitals [12/06/21 1512]  Enc Vitals Group     BP      Pulse      Resp      Temp      Temp src      SpO2      Weight      Height      Head Circumference      Peak Flow      Pain Score 0     Pain Loc      Pain Edu?      Excl. in GC?     Most recent vital signs: Vitals:   12/06/21 1513  BP: (!) 141/99  Pulse: 83  Resp: 18  Temp: 98.2 F (36.8 C)  SpO2: 95%     General: Awake, no distress.  CV:  Good peripheral perfusion.  Resp:  Normal effort.  Abd:  No distention.  Neuro:             Awake, Alert, Oriented x 3  Other:  Right ankle is externally rotated with obvious swelling and deformity, 2+ DP pulse, able to wiggle the toes, sensation grossly intact, no tenderness to palpation of the mid calf or fibular head   ED Results / Procedures / Treatments  Labs (all labs ordered are listed, but only abnormal results are displayed) Labs Reviewed  RESP PANEL BY RT-PCR (FLU A&B, COVID) ARPGX2  CBC WITH  DIFFERENTIAL/PLATELET  BASIC METABOLIC PANEL     EKG     RADIOLOGY I reviewed the x-ray which shows a bimalleolar fracture with some translation laterally but no obvious dislocation   PROCEDURES:  Critical Care performed:   MEDICATIONS ORDERED IN ED: Medications - No data to display   IMPRESSION / MDM / ASSESSMENT AND PLAN / ED COURSE  I reviewed the triage vital signs and the nursing notes.                              Differential diagnosis includes, but is not limited to, ankle fracture, ankle    Patient is a 56 year old male presenting with an injury to the right ankle after he rolled it while he slipped in the mud twisting the right ankle.  On exam he has obvious deformity and swelling in the right ankle but is neurovascular intact with good pulses,  there is no open injury.  X-ray reviewed by myself shows a bimalleolar fracture with some lateral translation but no obvious dislocation.  I discussed with Dr. Martha Clan with orthopedics to see if he felt that it needed to be reduced, and he did not feel that it needed to be reduced.  Recommended placing him in a posterior splint with a U and admit to the hospital for operative management.  Will discuss with the hospitalist who will consult to clear the patient for surgery.      FINAL CLINICAL IMPRESSION(S) / ED DIAGNOSES   Final diagnoses:  Closed bimalleolar fracture of right ankle, initial encounter     Rx / DC Orders   ED Discharge Orders     None        Note:  This document was prepared using Dragon voice recognition software and may include unintentional dictation errors.   Georga Hacking, MD 12/06/21 [phone]

## 2021-12-06 NOTE — Consult Note (Signed)
Initial Consultation Note   Patient: [name] [name] DOB: 1965/11/06 PCP: Renaye Rakers, MD DOA: 12/06/2021 DOS: the patient was seen and examined on 12/06/2021 Primary service: Juanell Fairly, MD  Referring physician: Juanell Fairly MD. Reason for consult: medical management / clearance for surgery.    Assessment/Plan:  Assessment and Plan: * Closed right ankle fracture, initial encounter- (present on admission) Per orthopedic. DVT prophylaxis post op.   Essential hypertension- (present on admission) Blood pressure (!) 168/98, pulse 91, temperature 98.1 F (36.7 C), temperature source Oral, resp. rate 19, height 6\' 2"  (1.88 m), weight 113.8 kg, SpO2 100 %. Continue metoprolol.  Elevated glucose- (present on admission) We will obtain a1c.    SCHIZOPHRENIA, PARANOID, CHRONIC- (present on admission) Continue home meds.   Asthma- (present on admission) Prn MDI.   TRH will continue to follow the patient.  HPI: [name] [name] is a 56 y.o. male with past medical history of asthma, depression, Htn. Schizophrenia and allergies to  cefproxil, cetirizine and doxycycline. Pt was in his yard in the mud  today and had a mechanical fall where he slipped because of rain in his yard and hurt his ankle. Pt was seen in ed by orthopedic and is needs to have operative repair and recommended a posterior splint with a U and admit to the hospital for operative management.    Review of Systems:  Review of Systems  Constitutional:  Negative for chills and fever.  HENT: Negative.    Eyes: Negative.   Respiratory: Negative.    Cardiovascular: Negative.   Gastrointestinal: Negative.   Musculoskeletal: Negative.   Neurological: Negative.   All other systems reviewed and are negative.  Past Medical History:  Diagnosis Date   Acute bronchitis    Asthma    Depression    Hypertension    Schizophrenia (HCC)    History reviewed. No pertinent surgical history. Social History:   reports that he has quit smoking. He does not have any smokeless tobacco history on file. He reports that he does not currently use alcohol. He reports that he does not currently use drugs.  Allergies  Allergen Reactions   Cefprozil    Cetirizine Hcl    Doxycycline     History reviewed. No pertinent family history.  Prior to Admission medications   Not on File    Physical Exam: Vitals:   12/06/21 1700 12/06/21 1730 12/06/21 1957 12/06/21 2012  BP: (!) 165/113 (!) 164/102 (!) 168/98   Pulse: 89 86 91   Resp: 14 15 19    Temp:   98.1 F (36.7 C)   TempSrc:   Oral   SpO2: 94% 100% 100%   Weight:    113.8 kg  Height:    6\' 2"  (1.88 m)  In ed pt is alert and hypertensive due to pain.  Xray showed Closed trimalleolar fracture of right ankle. Labs shows glucose 138 otherwise normal bmp, cbc with wbc count Of 13.3 and platelet of 114 otherwise normal. Resp panel is negative for flu and covid.  Physical Exam Vitals reviewed.  Constitutional:      General: He is not in acute distress.    Appearance: He is obese. He is not ill-appearing.  HENT:     Head: Normocephalic and atraumatic.     Right Ear: External ear normal.     Left Ear: External ear normal.     Nose: Nose normal.     Mouth/Throat:     Mouth: Mucous membranes are moist.  Eyes:     Extraocular Movements: Extraocular movements intact.     Pupils: Pupils are equal, round, and reactive to light.  Neck:     Vascular: No carotid bruit.  Cardiovascular:     Rate and Rhythm: Normal rate and regular rhythm.     Pulses: Normal pulses.     Heart sounds: Normal heart sounds.  Pulmonary:     Effort: Pulmonary effort is normal.     Breath sounds: Normal breath sounds.  Abdominal:     General: Bowel sounds are normal.     Palpations: Abdomen is soft.  Musculoskeletal:     Right lower leg: Edema present.     Left lower leg: No edema.  Skin:    General: Skin is warm.  Neurological:     General: No focal deficit  present.     Mental Status: He is alert and oriented to person, place, and time.  Psychiatric:        Mood and Affect: Mood normal.        Behavior: Behavior normal.   Data Reviewed:  Cmp shows glucose of 125 and cbc shows wbc count of 13.3, hb of 15.4, Respiratory panel is negative flu and Covid.  EKG ordered.   Family Communication:  [name] [name] (Mother)  [phone] [facility] Phone) Primary team communication: Dr. Martha Clan.  Thank you very much for involving [place] in the care of your patient.  Author: Gertha Calkin, MD 12/06/2021 9:59 PM  For on call review [email or web address].

## 2021-12-06 NOTE — Assessment & Plan Note (Addendum)
Continue nebulizer treatments.  Can go back on inhalers at facility.  Able to come off oxygen

## 2021-12-06 NOTE — Assessment & Plan Note (Addendum)
Continue Depakote, Cogentin, and Invega

## 2021-12-06 NOTE — Plan of Care (Signed)

## 2021-12-07 ENCOUNTER — Observation Stay: Payer: PPO | Admitting: Anesthesiology

## 2021-12-07 ENCOUNTER — Observation Stay: Payer: PPO

## 2021-12-07 ENCOUNTER — Encounter: Payer: Self-pay | Admitting: Orthopedic Surgery

## 2021-12-07 ENCOUNTER — Other Ambulatory Visit: Payer: Self-pay

## 2021-12-07 ENCOUNTER — Encounter
Admission: EM | Disposition: A | Discharge status: Skilled Nursing Facility | Payer: Self-pay | Source: Home / Self Care | Attending: Orthopedic Surgery

## 2021-12-07 DIAGNOSIS — J4520 Mild intermittent asthma, uncomplicated: Secondary | ICD-10-CM | POA: Diagnosis not present

## 2021-12-07 DIAGNOSIS — R7301 Impaired fasting glucose: Secondary | ICD-10-CM | POA: Diagnosis not present

## 2021-12-07 DIAGNOSIS — S82851S Displaced trimalleolar fracture of right lower leg, sequela: Secondary | ICD-10-CM | POA: Diagnosis not present

## 2021-12-07 DIAGNOSIS — S82841A Displaced bimalleolar fracture of right lower leg, initial encounter for closed fracture: Secondary | ICD-10-CM | POA: Diagnosis not present

## 2021-12-07 DIAGNOSIS — W010XXA Fall on same level from slipping, tripping and stumbling without subsequent striking against object, initial encounter: Secondary | ICD-10-CM | POA: Diagnosis not present

## 2021-12-07 DIAGNOSIS — I10 Essential (primary) hypertension: Secondary | ICD-10-CM | POA: Diagnosis not present

## 2021-12-07 DIAGNOSIS — Z01818 Encounter for other preprocedural examination: Secondary | ICD-10-CM | POA: Diagnosis not present

## 2021-12-07 DIAGNOSIS — Z9889 Other specified postprocedural states: Secondary | ICD-10-CM | POA: Diagnosis not present

## 2021-12-07 DIAGNOSIS — S82391A Other fracture of lower end of right tibia, initial encounter for closed fracture: Secondary | ICD-10-CM | POA: Diagnosis not present

## 2021-12-07 DIAGNOSIS — S82851A Displaced trimalleolar fracture of right lower leg, initial encounter for closed fracture: Secondary | ICD-10-CM | POA: Diagnosis not present

## 2021-12-07 DIAGNOSIS — S82831A Other fracture of upper and lower end of right fibula, initial encounter for closed fracture: Secondary | ICD-10-CM | POA: Diagnosis not present

## 2021-12-07 HISTORY — PX: ORIF ANKLE FRACTURE: SHX5408

## 2021-12-07 LAB — CBC
HCT: 43 % (ref 39.0–52.0)
Hemoglobin: 14.7 g/dL (ref 13.0–17.0)
MCH: 32.5 pg (ref 26.0–34.0)
MCHC: 34.2 g/dL (ref 30.0–36.0)
MCV: 95.1 fL (ref 80.0–100.0)
Platelets: 183 10*3/uL (ref 150–400)
RBC: 4.52 MIL/uL (ref 4.22–5.81)
RDW: 13 % (ref 11.5–15.5)
WBC: 11.4 10*3/uL — ABNORMAL HIGH (ref 4.0–10.5)
nRBC: 0 % (ref 0.0–0.2)

## 2021-12-07 SURGERY — OPEN REDUCTION INTERNAL FIXATION (ORIF) ANKLE FRACTURE
Anesthesia: General | Site: Ankle | Laterality: Right

## 2021-12-07 MED ORDER — LACTATED RINGERS IV SOLN
INTRAVENOUS | Status: DC | PRN
Start: 2021-12-07 — End: 2021-12-07

## 2021-12-07 MED ORDER — BUPIVACAINE HCL 0.25 % IJ SOLN
INTRAMUSCULAR | Status: DC | PRN
  Administered 2021-12-07: 30 mL

## 2021-12-07 MED ORDER — MIDAZOLAM HCL 2 MG/2ML IJ SOLN
INTRAMUSCULAR | Status: AC
  Filled 2021-12-07: qty 2

## 2021-12-07 MED ORDER — HYDROMORPHONE HCL 1 MG/ML IJ SOLN
INTRAMUSCULAR | Status: AC
  Filled 2021-12-07: qty 1

## 2021-12-07 MED ORDER — EPHEDRINE 5 MG/ML INJ
INTRAVENOUS | Status: AC
  Filled 2021-12-07: qty 5

## 2021-12-07 MED ORDER — FENTANYL CITRATE (PF) 100 MCG/2ML IJ SOLN
INTRAMUSCULAR | Status: AC
  Filled 2021-12-07: qty 2

## 2021-12-07 MED ORDER — DEXAMETHASONE SODIUM PHOSPHATE 10 MG/ML IJ SOLN
INTRAMUSCULAR | Status: DC | PRN
  Administered 2021-12-07: 10 mg via INTRAVENOUS

## 2021-12-07 MED ORDER — 0.9 % SODIUM CHLORIDE (POUR BTL) OPTIME
TOPICAL | Status: DC | PRN
  Administered 2021-12-07: 250 mL
  Administered 2021-12-07: 500 mL

## 2021-12-07 MED ORDER — FENTANYL CITRATE (PF) 100 MCG/2ML IJ SOLN: 25.0000 ug | INTRAMUSCULAR | Status: DC | PRN

## 2021-12-07 MED ORDER — DEXMEDETOMIDINE (PRECEDEX) IN NS 20 MCG/5ML (4 MCG/ML) IV SYRINGE
PREFILLED_SYRINGE | INTRAVENOUS | Status: AC
  Filled 2021-12-07: qty 5

## 2021-12-07 MED ORDER — DEXMEDETOMIDINE (PRECEDEX) IN NS 20 MCG/5ML (4 MCG/ML) IV SYRINGE
PREFILLED_SYRINGE | INTRAVENOUS | Status: DC | PRN
  Administered 2021-12-07: 4 ug via INTRAVENOUS
  Administered 2021-12-07 (×2): 8 ug via INTRAVENOUS

## 2021-12-07 MED ORDER — VANCOMYCIN HCL IN DEXTROSE 1-5 GM/200ML-% IV SOLN
INTRAVENOUS | Status: AC
  Administered 2021-12-07: 1000 mg via INTRAVENOUS
  Filled 2021-12-07: qty 200

## 2021-12-07 MED ORDER — SEVOFLURANE IN SOLN
RESPIRATORY_TRACT | Status: AC
  Filled 2021-12-07: qty 250

## 2021-12-07 MED ORDER — OXYCODONE HCL 5 MG/5ML PO SOLN: 5.0000 mg | Freq: Once | ORAL | Status: DC | PRN

## 2021-12-07 MED ORDER — PHENYLEPHRINE HCL (PRESSORS) 10 MG/ML IV SOLN
INTRAVENOUS | Status: DC | PRN
  Administered 2021-12-07 (×2): 160 ug via INTRAVENOUS
  Administered 2021-12-07: 80 ug via INTRAVENOUS
  Administered 2021-12-07: 160 ug via INTRAVENOUS
  Administered 2021-12-07: 80 ug via INTRAVENOUS
  Administered 2021-12-07: 160 ug via INTRAVENOUS

## 2021-12-07 MED ORDER — ROCURONIUM BROMIDE 100 MG/10ML IV SOLN
INTRAVENOUS | Status: DC | PRN
  Administered 2021-12-07: 50 mg via INTRAVENOUS

## 2021-12-07 MED ORDER — OXYCODONE HCL 5 MG PO TABS: 5.0000 mg | ORAL_TABLET | Freq: Once | ORAL | Status: DC | PRN

## 2021-12-07 MED ORDER — VANCOMYCIN HCL IN DEXTROSE 1-5 GM/200ML-% IV SOLN
1000.0000 mg | INTRAVENOUS | Status: DC
  Filled 2021-12-07: qty 200

## 2021-12-07 MED ORDER — LIDOCAINE HCL (CARDIAC) PF 100 MG/5ML IV SOSY
PREFILLED_SYRINGE | INTRAVENOUS | Status: DC | PRN
  Administered 2021-12-07: 80 mg via INTRAVENOUS

## 2021-12-07 MED ORDER — ACETAMINOPHEN 10 MG/ML IV SOLN
INTRAVENOUS | Status: DC | PRN
  Administered 2021-12-07: 1000 mg via INTRAVENOUS

## 2021-12-07 MED ORDER — BUPIVACAINE HCL (PF) 0.25 % IJ SOLN
INTRAMUSCULAR | Status: AC
  Filled 2021-12-07: qty 30

## 2021-12-07 MED ORDER — EPHEDRINE SULFATE (PRESSORS) 50 MG/ML IJ SOLN
INTRAMUSCULAR | Status: DC | PRN
  Administered 2021-12-07: 5 mg via INTRAVENOUS

## 2021-12-07 MED ORDER — FENTANYL CITRATE (PF) 100 MCG/2ML IJ SOLN
INTRAMUSCULAR | Status: DC | PRN
  Administered 2021-12-07 (×2): 50 ug via INTRAVENOUS
  Administered 2021-12-07: 25 ug via INTRAVENOUS
  Administered 2021-12-07: 50 ug via INTRAVENOUS
  Administered 2021-12-07: 25 ug via INTRAVENOUS

## 2021-12-07 MED ORDER — KETOROLAC TROMETHAMINE 30 MG/ML IJ SOLN
INTRAMUSCULAR | Status: DC | PRN
Start: 2021-12-07 — End: 2021-12-07
  Administered 2021-12-07: 30 mg via INTRAVENOUS

## 2021-12-07 MED ORDER — PROPOFOL 10 MG/ML IV BOLUS
INTRAVENOUS | Status: DC | PRN
  Administered 2021-12-07: 150 mg via INTRAVENOUS
  Administered 2021-12-07: 50 mg via INTRAVENOUS

## 2021-12-07 MED ORDER — SUGAMMADEX SODIUM 500 MG/5ML IV SOLN
INTRAVENOUS | Status: DC | PRN
Start: 2021-12-07 — End: 2021-12-07
  Administered 2021-12-07: 200 mg via INTRAVENOUS

## 2021-12-07 MED ORDER — MIDAZOLAM HCL 2 MG/2ML IJ SOLN
INTRAMUSCULAR | Status: DC | PRN
  Administered 2021-12-07: 2 mg via INTRAVENOUS

## 2021-12-07 MED ORDER — HYDROMORPHONE HCL 1 MG/ML IJ SOLN
INTRAMUSCULAR | Status: DC | PRN
  Administered 2021-12-07 (×4): .25 mg via INTRAVENOUS

## 2021-12-07 MED ORDER — KETOROLAC TROMETHAMINE 30 MG/ML IJ SOLN
INTRAMUSCULAR | Status: AC
  Filled 2021-12-07: qty 1

## 2021-12-07 MED ORDER — ACETAMINOPHEN 10 MG/ML IV SOLN
INTRAVENOUS | Status: AC
  Filled 2021-12-07: qty 100

## 2021-12-07 SURGICAL SUPPLY — 58 items
BIT DRILL 2.5X110 QC LCP DISP (BIT) ×1 IMPLANT
BLADE SURG 15 STRL LF DISP TIS (BLADE) ×2 IMPLANT
BLADE SURG 15 STRL SS (BLADE) ×2
BNDG CMPR STD VLCR NS LF 5.8X4 (GAUZE/BANDAGES/DRESSINGS) ×2
BNDG COHESIVE 4X5 TAN ST LF (GAUZE/BANDAGES/DRESSINGS) ×3 IMPLANT
BNDG ELASTIC 4X5.8 VLCR NS LF (GAUZE/BANDAGES/DRESSINGS) ×6 IMPLANT
BNDG ESMARK 6X12 TAN STRL LF (GAUZE/BANDAGES/DRESSINGS) ×3 IMPLANT
CUFF TOURN SGL QUICK 24 (TOURNIQUET CUFF)
CUFF TOURN SGL QUICK 34 (TOURNIQUET CUFF)
CUFF TRNQT CYL 24X4X16.5-23 (TOURNIQUET CUFF) IMPLANT
CUFF TRNQT CYL 34X4.125X (TOURNIQUET CUFF) IMPLANT
DRAPE FLUOR MINI C-ARM 54X84 (DRAPES) ×3 IMPLANT
DRAPE INCISE IOBAN 66X45 STRL (DRAPES) ×3 IMPLANT
DRAPE U-SHAPE 47X51 STRL (DRAPES) ×3 IMPLANT
DURAPREP 26ML APPLICATOR (WOUND CARE) ×6 IMPLANT
ELECT REM PT RETURN 9FT ADLT (ELECTROSURGICAL) ×2
ELECTRODE REM PT RTRN 9FT ADLT (ELECTROSURGICAL) ×2 IMPLANT
GAUZE SPONGE 4X4 12PLY STRL (GAUZE/BANDAGES/DRESSINGS) ×3 IMPLANT
GAUZE XEROFORM 1X8 LF (GAUZE/BANDAGES/DRESSINGS) ×3 IMPLANT
GLOVE SURG ORTHO LTX SZ9 (GLOVE) ×6 IMPLANT
GLOVE SURG UNDER POLY LF SZ9 (GLOVE) ×3 IMPLANT
GOWN STRL REUS TWL 2XL XL LVL4 (GOWN DISPOSABLE) ×3 IMPLANT
GOWN STRL REUS W/ TWL LRG LVL3 (GOWN DISPOSABLE) ×2 IMPLANT
GOWN STRL REUS W/TWL LRG LVL3 (GOWN DISPOSABLE) ×2
K-WIRE 1.25 TRCR POINT 150 (WIRE) ×2
KIT TURNOVER KIT A (KITS) ×3 IMPLANT
KWIRE 1.25 TRCR POINT 150 (WIRE) IMPLANT
LABEL OR SOLS (LABEL) ×3 IMPLANT
MANIFOLD NEPTUNE II (INSTRUMENTS) ×3 IMPLANT
NS IRRIG 1000ML POUR BTL (IV SOLUTION) ×2 IMPLANT
NS IRRIG 500ML POUR BTL (IV SOLUTION) ×2 IMPLANT
PACK EXTREMITY ARMC (MISCELLANEOUS) ×3 IMPLANT
PAD ABD DERMACEA PRESS 5X9 (GAUZE/BANDAGES/DRESSINGS) ×6 IMPLANT
PAD CAST CTTN 4X4 STRL (SOFTGOODS) ×6 IMPLANT
PADDING CAST COTTON 4X4 STRL (SOFTGOODS) ×6
PLATE LCP 3.5 1/3 TUB 8HX93 (Plate) ×1 IMPLANT
SCREW CANC FT 4.0X20 (Screw) ×1 IMPLANT
SCREW CANC FT/18 4.0 (Screw) ×2 IMPLANT
SCREW CANN L THRD/40 4.0 (Screw) ×2 IMPLANT
SCREW CORTEX 3.5 12MM (Screw) ×1 IMPLANT
SCREW CORTEX 3.5 14MM (Screw) ×2 IMPLANT
SCREW CORTEX 3.5 24MM (Screw) ×1 IMPLANT
SCREW CORTEX 3.5 36MM (Screw) ×1 IMPLANT
SCREW LOCK CORT ST 3.5X12 (Screw) IMPLANT
SCREW LOCK CORT ST 3.5X14 (Screw) IMPLANT
SCREW LOCK CORT ST 3.5X24 (Screw) IMPLANT
SCREW LOCK CORT ST 3.5X36 (Screw) IMPLANT
SPLINT CAST 1 STEP 4X30 (MISCELLANEOUS) ×6 IMPLANT
SPONGE T-LAP 18X18 ~~LOC~~+RFID (SPONGE) ×3 IMPLANT
STAPLER SKIN PROX 35W (STAPLE) ×3 IMPLANT
STOCKINETTE STRL 6IN 960660 (GAUZE/BANDAGES/DRESSINGS) ×3 IMPLANT
STRIP CLOSURE SKIN 1/2X4 (GAUZE/BANDAGES/DRESSINGS) ×6 IMPLANT
SUT VIC AB 0 CT2 27 (SUTURE) ×1 IMPLANT
SUT VIC AB 2-0 SH 27 (SUTURE) ×4
SUT VIC AB 2-0 SH 27XBRD (SUTURE) ×4 IMPLANT
SYR 30ML LL (SYRINGE) ×3 IMPLANT
TAPE PAPER 2X10 WHT MICROPORE (GAUZE/BANDAGES/DRESSINGS) ×3 IMPLANT
WATER STERILE IRR 500ML POUR (IV SOLUTION) ×2 IMPLANT

## 2021-12-07 NOTE — Transfer of Care (Signed)
Immediate Anesthesia Transfer of Care Note  Patient: [name] [name]  Procedure(s) Performed: OPEN REDUCTION INTERNAL FIXATION (ORIF) ANKLE FRACTURE (Right: Ankle)  Patient Location: PACU  Anesthesia Type:General  Level of Consciousness: drowsy and patient cooperative  Airway & Oxygen Therapy: Patient Spontanous Breathing and Patient connected to face mask oxygen  Post-op Assessment: Report given to RN and Post -op Vital signs reviewed and stable  Post vital signs: Reviewed and stable  Last Vitals:  Vitals Value Taken Time  BP 109/69 12/07/21 1745  Temp    Pulse 98 12/07/21 1750  Resp 15 12/07/21 1750  SpO2 97 % 12/07/21 1750  Vitals shown include unvalidated device data.  Last Pain:  Vitals:   12/07/21 1346  TempSrc: Temporal  PainSc: 10-Worst pain ever         Complications: No notable events documented.

## 2021-12-07 NOTE — Anesthesia Procedure Notes (Signed)
Procedure Name: Intubation Date/Time: 12/07/2021 3:45 PM Performed by: Elmarie Mainland, CRNA Pre-anesthesia Checklist: Patient identified, Emergency Drugs available, Suction available and Patient being monitored Patient Re-evaluated:Patient Re-evaluated prior to induction Oxygen Delivery Method: Circle system utilized Preoxygenation: Pre-oxygenation with 100% oxygen Induction Type: IV induction Ventilation: Mask ventilation without difficulty Laryngoscope Size: McGraph and 4 Grade View: Grade I Tube type: Oral Tube size: 7.5 mm Number of attempts: 1 Airway Equipment and Method: Stylet, Oral airway and Video-laryngoscopy Placement Confirmation: ETT inserted through vocal cords under direct vision, positive ETCO2 and breath sounds checked- equal and bilateral Secured at: 23 cm Tube secured with: Tape Dental Injury: Teeth and Oropharynx as per pre-operative assessment

## 2021-12-07 NOTE — Plan of Care (Signed)
°  Problem: Health Behavior/Discharge Planning: Goal: Ability to manage health-related needs will improve Outcome: Progressing   Problem: Clinical Measurements: Goal: Ability to maintain clinical measurements within normal limits will improve Outcome: Progressing   Problem: Clinical Measurements: Goal: Diagnostic test results will improve Outcome: Progressing   Problem: Clinical Measurements: Goal: Respiratory complications will improve Outcome: Progressing   Problem: Coping: Goal: Level of anxiety will decrease Outcome: Progressing   Problem: Safety: Goal: Ability to remain free from injury will improve Outcome: Progressing

## 2021-12-07 NOTE — Assessment & Plan Note (Addendum)
Hemoglobin A1c 5.7.  Patient is not a diabetic.

## 2021-12-07 NOTE — Anesthesia Preprocedure Evaluation (Signed)
Anesthesia Evaluation  Patient identified by MRN, date of birth, ID band Patient awake    Reviewed: Allergy & Precautions, NPO status , Patient's Chart, lab work & pertinent test results  History of Anesthesia Complications Negative for: history of anesthetic complications  Airway Mallampati: III  TM Distance: >3 FB Neck ROM: full    Dental  (+) Chipped, Poor Dentition, Missing   Pulmonary asthma , former smoker,    Pulmonary exam normal        Cardiovascular hypertension, Normal cardiovascular exam     Neuro/Psych PSYCHIATRIC DISORDERS negative neurological ROS     GI/Hepatic negative GI ROS, Neg liver ROS,   Endo/Other  negative endocrine ROS  Renal/GU      Musculoskeletal   Abdominal   Peds  Hematology negative hematology ROS (+)   Anesthesia Other Findings Past Medical History: No date: Acute bronchitis No date: Asthma No date: Depression No date: Hypertension No date: Schizophrenia [facility])  History reviewed. No pertinent surgical history.  BMI    Body Mass Index: 32.21 kg/m      Reproductive/Obstetrics negative OB ROS                             Anesthesia Physical Anesthesia Plan  ASA: 3  Anesthesia Plan: General ETT   Post-op Pain Management:    Induction: Intravenous  PONV Risk Score and Plan: Ondansetron, Dexamethasone, Midazolam and Treatment may vary due to age or medical condition  Airway Management Planned: Oral ETT  Additional Equipment:   Intra-op Plan:   Post-operative Plan: Extubation in OR  Informed Consent: I have reviewed the patients History and Physical, chart, labs and discussed the procedure including the risks, benefits and alternatives for the proposed anesthesia with the patient or authorized representative who has indicated his/her understanding and acceptance.     Dental Advisory Given  Plan Discussed with: Anesthesiologist, CRNA  and Surgeon  Anesthesia Plan Comments: (History and phone consent from the patients mother Starling Manns at [phone]   Patient and mother consented for risks of anesthesia including but not limited to:  - adverse reactions to medications - damage to eyes, teeth, lips or other oral mucosa - nerve damage due to positioning  - sore throat or hoarseness - Damage to heart, brain, nerves, lungs, other parts of body or loss of life  They voiced understanding.)        Anesthesia Quick Evaluation

## 2021-12-07 NOTE — Progress Notes (Signed)
The patient has been re-examined, and the chart reviewed, and there have been no interval changes to the documented history and physical.    The risks, benefits, and alternatives have been discussed at length, and the patient is willing to proceed.   

## 2021-12-07 NOTE — Progress Notes (Signed)
°  Progress Note   Patient: [name] [name] IOE:[id number] DOB: 08-19-1966 DOA: 12/06/2021     0 DOS: the patient was seen and examined on 12/07/2021    Assessment and Plan: * Closed right ankle fracture, initial encounter- (present on admission) Dr. Martha Clan to go take to the operating room for ankle repair surgery today.  No contraindications to surgery at this time.  Essential hypertension- (present on admission) Continue Toprol-XL.  Impaired fasting glucose Hemoglobin A1c pending  Asthma- (present on admission) Respiratory status stable.  Lungs are clear.  SCHIZOPHRENIA, PARANOID, CHRONIC- (present on admission) Continue Depakote, Cogentin, and Invega        Subjective: The patient states that he slipped in the mud.  He lives at a group home.  Does have some pain in the ankle.  Otherwise offers no complaints.  No chest pain or shortness of breath.  Physical Exam: Vitals:   12/06/21 2012 12/07/21 0504 12/07/21 0740 12/07/21 1119  BP:  (!) 145/89 (!) 164/93 (!) 147/97  Pulse:  94 98 87  Resp:  16 16 16   Temp:  97.6 F (36.4 C) (!) 97.4 F (36.3 C) 98.7 F (37.1 C)  TempSrc:      SpO2:  98% 94% 95%  Weight: 113.8 kg     Height: 6\' 2"  (1.88 m)      Physical Exam HENT:     Head: Normocephalic.     Mouth/Throat:     Pharynx: No oropharyngeal exudate.  Eyes:     General: Lids are normal.     Conjunctiva/sclera: Conjunctivae normal.  Cardiovascular:     Rate and Rhythm: Normal rate and regular rhythm.     Heart sounds: Normal heart sounds, S1 normal and S2 normal.  Pulmonary:     Breath sounds: No decreased breath sounds, wheezing, rhonchi or rales.  Abdominal:     Palpations: Abdomen is soft.     Tenderness: There is no abdominal tenderness.  Musculoskeletal:     Left lower leg: No swelling.     Comments: Right ankle wrapped with Ace bandage.  Skin:    General: Skin is warm.     Findings: No rash.  Neurological:     Mental Status: He is alert and oriented  to person, place, and time.     Comments: Patient able to wiggle toes bilaterally.     Data Reviewed: Laboratory and radiological data results reviewed by me.  Hemoglobin A1c pending for elevated sugar.  CT and x-ray showing trimalleolar ankle fracture.  Family Communication: As per orthopedic surgery since the patient is having surgery today  Disposition: Status is: Observation The patient remains OBS appropriate and will d/c before 2 midnights.   Planned Discharge Destination: Group home  Author: , MD 12/07/2021 12:37 PM  For on call review [email or web address].

## 2021-12-07 NOTE — Anesthesia Postprocedure Evaluation (Signed)
Anesthesia Post Note  Patient: [name] [name]  Procedure(s) Performed: OPEN REDUCTION INTERNAL FIXATION (ORIF) ANKLE FRACTURE (Right: Ankle)  Patient location during evaluation: PACU Anesthesia Type: General Level of consciousness: awake and alert Pain management: pain level controlled Vital Signs Assessment: post-procedure vital signs reviewed and stable Respiratory status: spontaneous breathing, nonlabored ventilation, respiratory function stable and patient connected to nasal cannula oxygen Cardiovascular status: blood pressure returned to baseline and stable Postop Assessment: no apparent nausea or vomiting Anesthetic complications: no   No notable events documented.   Last Vitals:  Vitals:   12/07/21 1920 12/07/21 1942  BP:  (!) 125/94  Pulse: (!) 103 (!) 101  Resp: 13 14  Temp: (!) 36.1 C 37.1 C  SpO2: 97% 96%    Last Pain:  Vitals:   12/07/21 1942  TempSrc: Oral  PainSc: 0-No pain                 Lenard Simmer

## 2021-12-08 DIAGNOSIS — R5381 Other malaise: Secondary | ICD-10-CM | POA: Diagnosis not present

## 2021-12-08 DIAGNOSIS — F200 Paranoid schizophrenia: Secondary | ICD-10-CM | POA: Diagnosis present

## 2021-12-08 DIAGNOSIS — J4520 Mild intermittent asthma, uncomplicated: Secondary | ICD-10-CM | POA: Diagnosis not present

## 2021-12-08 DIAGNOSIS — W010XXA Fall on same level from slipping, tripping and stumbling without subsequent striking against object, initial encounter: Secondary | ICD-10-CM | POA: Diagnosis present

## 2021-12-08 DIAGNOSIS — J45909 Unspecified asthma, uncomplicated: Secondary | ICD-10-CM | POA: Diagnosis present

## 2021-12-08 DIAGNOSIS — E860 Dehydration: Secondary | ICD-10-CM

## 2021-12-08 DIAGNOSIS — Z888 Allergy status to other drugs, medicaments and biological substances status: Secondary | ICD-10-CM | POA: Diagnosis not present

## 2021-12-08 DIAGNOSIS — Z20822 Contact with and (suspected) exposure to covid-19: Secondary | ICD-10-CM | POA: Diagnosis present

## 2021-12-08 DIAGNOSIS — Y92007 Garden or yard of unspecified non-institutional (private) residence as the place of occurrence of the external cause: Secondary | ICD-10-CM | POA: Diagnosis not present

## 2021-12-08 DIAGNOSIS — S82851S Displaced trimalleolar fracture of right lower leg, sequela: Secondary | ICD-10-CM | POA: Diagnosis not present

## 2021-12-08 DIAGNOSIS — Z881 Allergy status to other antibiotic agents status: Secondary | ICD-10-CM | POA: Diagnosis not present

## 2021-12-08 DIAGNOSIS — Y9301 Activity, walking, marching and hiking: Secondary | ICD-10-CM | POA: Diagnosis present

## 2021-12-08 DIAGNOSIS — I10 Essential (primary) hypertension: Secondary | ICD-10-CM | POA: Diagnosis present

## 2021-12-08 DIAGNOSIS — R7303 Prediabetes: Secondary | ICD-10-CM | POA: Diagnosis present

## 2021-12-08 DIAGNOSIS — R7301 Impaired fasting glucose: Secondary | ICD-10-CM

## 2021-12-08 DIAGNOSIS — Z87891 Personal history of nicotine dependence: Secondary | ICD-10-CM | POA: Diagnosis not present

## 2021-12-08 DIAGNOSIS — X501XXA Overexertion from prolonged static or awkward postures, initial encounter: Secondary | ICD-10-CM | POA: Diagnosis not present

## 2021-12-08 DIAGNOSIS — S82899A Other fracture of unspecified lower leg, initial encounter for closed fracture: Secondary | ICD-10-CM | POA: Diagnosis present

## 2021-12-08 DIAGNOSIS — S82851A Displaced trimalleolar fracture of right lower leg, initial encounter for closed fracture: Secondary | ICD-10-CM | POA: Diagnosis present

## 2021-12-08 DIAGNOSIS — R531 Weakness: Secondary | ICD-10-CM | POA: Diagnosis not present

## 2021-12-08 DIAGNOSIS — E669 Obesity, unspecified: Secondary | ICD-10-CM | POA: Diagnosis present

## 2021-12-08 DIAGNOSIS — Z7401 Bed confinement status: Secondary | ICD-10-CM | POA: Diagnosis not present

## 2021-12-08 DIAGNOSIS — Z6832 Body mass index (BMI) 32.0-32.9, adult: Secondary | ICD-10-CM | POA: Diagnosis not present

## 2021-12-08 DIAGNOSIS — F32A Depression, unspecified: Secondary | ICD-10-CM | POA: Diagnosis present

## 2021-12-08 LAB — CBC
HCT: 42.9 % (ref 39.0–52.0)
Hemoglobin: 14.3 g/dL (ref 13.0–17.0)
MCH: 32.1 pg (ref 26.0–34.0)
MCHC: 33.3 g/dL (ref 30.0–36.0)
MCV: 96.2 fL (ref 80.0–100.0)
Platelets: 170 10*3/uL (ref 150–400)
RBC: 4.46 MIL/uL (ref 4.22–5.81)
RDW: 13.2 % (ref 11.5–15.5)
WBC: 12.6 10*3/uL — ABNORMAL HIGH (ref 4.0–10.5)
nRBC: 0 % (ref 0.0–0.2)

## 2021-12-08 LAB — HEMOGLOBIN A1C
Hgb A1c MFr Bld: 5.7 % — ABNORMAL HIGH (ref 4.8–5.6)
Mean Plasma Glucose: 117 mg/dL

## 2021-12-08 LAB — BASIC METABOLIC PANEL
Anion gap: 8 (ref 5–15)
BUN: 17 mg/dL (ref 6–20)
CO2: 27 mmol/L (ref 22–32)
Calcium: 8.5 mg/dL — ABNORMAL LOW (ref 8.9–10.3)
Chloride: 102 mmol/L (ref 98–111)
Creatinine, Ser: 1.34 mg/dL — ABNORMAL HIGH (ref 0.61–1.24)
GFR, Estimated: >60 mL/min (ref >60)
Glucose, Bld: 117 mg/dL — ABNORMAL HIGH (ref 70–99)
Potassium: 4.9 mmol/L (ref 3.5–5.1)
Sodium: 137 mmol/L (ref 135–145)

## 2021-12-08 MED ORDER — ENOXAPARIN SODIUM 40 MG/0.4ML IJ SOSY
40.0000 mg | PREFILLED_SYRINGE | Freq: Every day | INTRAMUSCULAR | Status: DC
  Administered 2021-12-08 – 2021-12-12 (×5): 40 mg via SUBCUTANEOUS
  Filled 2021-12-08 (×5): qty 0.4

## 2021-12-08 MED ORDER — SODIUM CHLORIDE 0.9 % IV BOLUS
500.0000 mL | Freq: Once | INTRAVENOUS | Status: AC
  Administered 2021-12-08: 500 mL via INTRAVENOUS

## 2021-12-08 MED ORDER — VANCOMYCIN HCL IN DEXTROSE 1-5 GM/200ML-% IV SOLN
1000.0000 mg | Freq: Once | INTRAVENOUS | Status: AC
Start: 2021-12-08 — End: 2021-12-08
  Administered 2021-12-08: 1000 mg via INTRAVENOUS
  Filled 2021-12-08: qty 200

## 2021-12-08 NOTE — Clinical Social Work Note (Signed)
RE:  [name] [name]   Date of Birth:   Jul 22, 2067___________  Date:  12/08/21      To Whom It May Concern:  Please be advised that the above-named patient will require a short-term nursing home stay - anticipated 30 days or less for rehabilitation and strengthening.  The plan is for return home.   MD electronic signature noted below

## 2021-12-08 NOTE — Op Note (Signed)
12/07/2021  11:23 AM  PATIENT:  [name] [name]    PRE-OPERATIVE DIAGNOSIS:  Right closed, displaced trimalleolar ankle fracture  POST-OPERATIVE DIAGNOSIS:  Same  PROCEDURE:  OPEN REDUCTION INTERNAL FIXATION (ORIF) OF RIGHT BIMALLEOLAR ANKLE FRACTURE  SURGEON:  Thornton Park, MD  ANESTHESIA:   General  PREOPERATIVE INDICATIONS:  MAAZ SPIERING is a  56 y.o. male with a diagnosis of presented to the [facility] regional emergency department with a closed, displaced trimalleolar right ankle fracture.  Given the fracture displacement patient underwent a closed reduction in the ER.  I recommended open reduction and fixation for his fracture.  I discussed the risks and benefits of surgery. The risks include but are not limited to infection, bleeding requiring blood transfusion, nerve or blood vessel injury, joint stiffness or loss of motion, persistent pain, weakness or instability, malunion, nonunion and hardware failure and the need for further surgery. Patient understood these risks and wished to proceed.   OPERATIVE IMPLANTS: Synthes 8 hole one third tubular plate for lateral fixation and Synthes 4.0 mm cannulated screws for medial fixation.  OPERATIVE FINDINGS: Displaced right trimalleolar ankle fracture with small posterior malleolus fracture.  OPERATIVE PROCEDURE:   Patient was met in the preoperative area. The right leg was signed my initials and the word yes according the hospital's correct site of surgery protocol. The patient was brought to the operating room where he underwent general anesthesia. The patient was placed supine on the operative table. A bump was placed under the right hip. A tourniquet was applied to the right thigh.  The lower extremity was prepped and draped in a sterile fashion. A timeout was performed to verify the patient's name, date of birth, medical record number, correct site of surgery and correct procedure to be performed. It was also used to verify the  patient received antibiotics, and that all appropriate instruments, implants and radiographic studies were available in the room. Once all in attendance were in agreement, the case began.  The right lower extremity was exsanguinated with an Esmarch. The tourniquet was inflated to 275 mmHg. This was applied for a total of 88 minutes. A lateral incision was made over the fibula. The subcutaneous tissues were dissected with the Metzenbaum scissor and pickup. Care was taken to avoid injury to the superficial peroneal nerve. The lateral malleolus fracture was identified and irrigated and fracture hematoma was removed. Soft tissue was removed from the fracture site using a periosteal elevator. A fracture reduction clamp was then used to reduce the fracture to an anatomic position.     The lateral malleolus was then drilled in an AP direction, perpendicular to the fracture site to allow for placement of the lag screw.   Two lag screws were placed in the long oblique fracture of the lateral malleolus.  The 2 lag screws were advanced across the fracture site by hand compressing the fracture.   An 8 hole, 1/3 tubular plate was then contoured and placed along the lateral fibula. Three bicortical screws were placed proximal to the fracture and three fully threaded cancellus screws were placed distal the fracture. The fracture reduction and hardware placement were confirmed on AP and lateral imaging.  Once the lateral malleolus was plated, the attention was turned to the medial ankle. A small vertical incision was made over the tip of the medial malleolus.  Soft tissue was dissected with some with the Metzenbaum scissor and pickup. The fracture of the medial malleolus was identified. This was reduced with a dental  pick.  Two threaded K wires for the 4.0 cannulated screws were then advanced through the tip of the medial malleolus across the fracture site and into the distal tibia. The position of the K wires was evaluated  on AP and lateral FluoroScan images. The length of the wires were measured with a depth gauge and were determined to be both 40 mm in length. The wires were then overdrilled with a cannulated drill for the 4.0 cannulated screws. The two long threaded 4.0 cannulated screws were then advanced into position by hand, compressing the medial malleolus fracture.    The posterior malleolus was then examined under fluoroscopy. It was felt to be less than 20% of the articular surface and was in a near anatomic position. The decision was made made not to place an AP screw given its stability and small size.  A stress test of the right ankle was then performed under fluoroscopy.  This test did not reveal any syndesmotic injury or opening of the medial clear space.  The medial and lateral incisions were then copiously irrigated. The subcutaneous tissue was closed with 2-0 Vicryl and the skin approximated staples. A dry sterile dressing was applied along with an AO splint. The patient's ankle was positioned in neutral. The pateint was then awoken from anesthesia, transferred to hospital bed and brought to the PACU in stable condition. I was scrubbed and present the entire case and all sharp and instrument counts were correct at conclusion the case. I spoke to the patient's mother by phone post-operatively  to let her know the case was performed without complication and the patient was stable in recovery room.     Timoteo Gaul, MD

## 2021-12-08 NOTE — Assessment & Plan Note (Addendum)
Dr. Martha Clan took to the operating room on 12/08/2021 for ORIF.  Pain control.  Physical therapy recommending rehab.  Patient nonweightbearing right ankle.  On DVT prophylaxis with Lovenox.  Still awaiting insurance authorization for rehab

## 2021-12-08 NOTE — Progress Notes (Signed)
°  Progress Note   Patient: [name] [name] TWS:[id number] DOB: Mar 22, 1966 DOA: 12/06/2021     0 DOS: the patient was seen and examined on 12/08/2021    Assessment and Plan: * Closed trimalleolar fracture of right ankle Dr. Martha Clan took to the operating room yesterday for ORIF.  Pain control.  Physical therapy recommending rehab.  Patient nonweightbearing right ankle.  Dehydration Patient's creatinine went up to 1.34.  Yesterday's creatinine was 1.09.  I did give an IV fluid bolus and will continue fluids today and check creatinine again tomorrow.  Essential hypertension- (present on admission) Continue Toprol-XL.  Impaired fasting glucose Hemoglobin A1c 5.7.  Patient is not a diabetic.  Asthma- (present on admission) Respiratory status stable.  Lungs are clear.  Nursing staff was able to take him off oxygen.  SCHIZOPHRENIA, PARANOID, CHRONIC- (present on admission) Continue Depakote, Cogentin, and Invega        Subjective: Patient feels okay.  Does have a little pain in his right ankle.  No complaints of shortness of breath.  Was on oxygen this morning.  Came in after a fall remote and found to have a trimalleolar ankle fracture.  Physical Exam: Vitals:   12/08/21 0015 12/08/21 0351 12/08/21 0815 12/08/21 1249  BP: 123/82 110/73 127/84 119/76  Pulse: 80 72 88 (!) 102  Resp: 12 15 15 15   Temp: (!) 97.4 F (36.3 C) 97.7 F (36.5 C) (!) 97.5 F (36.4 C) 98.3 F (36.8 C)  TempSrc:      SpO2: 100% 99% 99% 96%  Weight:      Height:       Physical Exam HENT:     Head: Normocephalic.     Mouth/Throat:     Pharynx: No oropharyngeal exudate.  Eyes:     General: Lids are normal.     Conjunctiva/sclera: Conjunctivae normal.  Cardiovascular:     Rate and Rhythm: Normal rate and regular rhythm.     Heart sounds: Normal heart sounds, S1 normal and S2 normal.  Pulmonary:     Breath sounds: No decreased breath sounds, wheezing, rhonchi or rales.  Abdominal:     Palpations:  Abdomen is soft.     Tenderness: There is no abdominal tenderness.  Musculoskeletal:     Left lower leg: No swelling.     Comments: Right ankle wrapped with Ace bandage.  Skin:    General: Skin is warm.     Findings: No rash.  Neurological:     Mental Status: He is alert and oriented to person, place, and time.     Comments: Patient able to wiggle toes bilaterally.     Data Reviewed: Today's creatinine up at 1.34.  Family Communication: Spoke with the patient's mother on the phone and she states that he lives at [facility] family care [facility] in [place].  Disposition: Status is: Inpatient Remains inpatient appropriate because: Now physical therapy recommending rehab  Planned Discharge Destination: Rehab  Author: Hardin, MD 12/08/2021 2:27 PM  For on call review [email or web address].

## 2021-12-08 NOTE — Assessment & Plan Note (Addendum)
Patient's creatinine went up to 1.34 now back to 1.10.  Fluids discontinued

## 2021-12-08 NOTE — Progress Notes (Signed)
Subjective:  POD #1 s/p ORIF of right bimalleolar ankle fracture.   Patient denies any significant right ankle pain laying in bed.  Patient's physical therapist is in the room and is going to begin working with the patient.    Objective:   VITALS:   Vitals:   12/07/21 1942 12/08/21 0015 12/08/21 0351 12/08/21 0815  BP: (!) 125/94 123/82 110/73 127/84  Pulse: (!) 101 80 72 88  Resp: 14 12 15 15   Temp: 98.7 F (37.1 C) (!) 97.4 F (36.3 C) 97.7 F (36.5 C) (!) 97.5 F (36.4 C)  TempSrc: Oral     SpO2: 96% 100% 99% 99%  Weight:      Height:        PHYSICAL EXAM: Right lower extremity: Patient's AO splint is clean dry and intact. Patient's toes are perfused.  She has intact sensation light touch in his toes and first dorsal webspace. Neurovascular intact Sensation intact distally Compartment soft  LABS  Results for orders placed or performed during the hospital encounter of 12/06/21 (from the past 24 hour(s))  CBC     Status: Abnormal   Collection Time: 12/08/21  4:54 AM  Result Value Ref Range   WBC 12.6 (H) 4.0 - 10.5 K/uL   RBC 4.46 4.22 - 5.81 MIL/uL   Hemoglobin 14.3 13.0 - 17.0 g/dL   HCT 16.142.9 09.639.0 - 04.552.0 %   MCV 96.2 80.0 - 100.0 fL   MCH 32.1 26.0 - 34.0 pg   MCHC 33.3 30.0 - 36.0 g/dL   RDW 40.913.2 81.111.5 - 91.415.5 %   Platelets 170 150 - 400 K/uL   nRBC 0.0 0.0 - 0.2 %  Basic metabolic panel     Status: Abnormal   Collection Time: 12/08/21  4:54 AM  Result Value Ref Range   Sodium 137 135 - 145 mmol/L   Potassium 4.9 3.5 - 5.1 mmol/L   Chloride 102 98 - 111 mmol/L   CO2 27 22 - 32 mmol/L   Glucose, Bld 117 (H) 70 - 99 mg/dL   BUN 17 6 - 20 mg/dL   Creatinine, Ser 7.821.34 (H) 0.61 - 1.24 mg/dL   Calcium 8.5 (L) 8.9 - 10.3 mg/dL   GFR, Estimated >95>60 >62>60 mL/min   Anion gap 8 5 - 15    DG Knee 2 Views Right  Result Date: 12/06/2021 CLINICAL DATA:  Slipped and fell, twisting injury, pain and swelling EXAM: RIGHT FOOT - 2 VIEW; RIGHT KNEE - 1-2 VIEW; RIGHT ANKLE  - 2 VIEW COMPARISON:  None. FINDINGS: Right knee: Frontal and lateral views demonstrate no fractures. Mild medial compartmental joint space narrowing. Trace joint effusion. Soft tissues are unremarkable. Right ankle: Frontal and cross-table lateral views of the right ankle are obtained. There is an oblique lateral malleolar fracture as well as a transverse medial malleolar fracture. Mild dorsal and lateral translation of the talus relative to the tibial plafond. I do not see any definite posterior malleolar fracture. Mild tibiotalar osteoarthritis. Diffuse soft tissue swelling. Right foot: Frontal and lateral views are obtained. No acute fracture. Joint spaces are well preserved. Mild soft tissue swelling of the hindfoot. IMPRESSION: 1. Bimalleolar right ankle fracture, with slight dorsal and lateral translation of the talus relative to the tibial plafond. No evidence of dislocation. 2. Diffuse soft tissue swelling of the right ankle and hindfoot. 3. Mild medial compartmental right knee osteoarthritis and trace joint effusion. Electronically Signed   By: Sharlet SalinaMichael  Brown M.D.   On: 12/06/2021 16:38  DG Ankle 2 Views Right  Result Date: 12/06/2021 CLINICAL DATA:  Slipped and fell, twisting injury, pain and swelling EXAM: RIGHT FOOT - 2 VIEW; RIGHT KNEE - 1-2 VIEW; RIGHT ANKLE - 2 VIEW COMPARISON:  None. FINDINGS: Right knee: Frontal and lateral views demonstrate no fractures. Mild medial compartmental joint space narrowing. Trace joint effusion. Soft tissues are unremarkable. Right ankle: Frontal and cross-table lateral views of the right ankle are obtained. There is an oblique lateral malleolar fracture as well as a transverse medial malleolar fracture. Mild dorsal and lateral translation of the talus relative to the tibial plafond. I do not see any definite posterior malleolar fracture. Mild tibiotalar osteoarthritis. Diffuse soft tissue swelling. Right foot: Frontal and lateral views are obtained. No acute  fracture. Joint spaces are well preserved. Mild soft tissue swelling of the hindfoot. IMPRESSION: 1. Bimalleolar right ankle fracture, with slight dorsal and lateral translation of the talus relative to the tibial plafond. No evidence of dislocation. 2. Diffuse soft tissue swelling of the right ankle and hindfoot. 3. Mild medial compartmental right knee osteoarthritis and trace joint effusion. Electronically Signed   By: Sharlet Salina M.D.   On: 12/06/2021 16:38   CT ANKLE RIGHT WO CONTRAST  Result Date: 12/06/2021 CLINICAL DATA:  Ankle trauma, fracture EXAM: CT OF THE RIGHT ANKLE WITHOUT CONTRAST TECHNIQUE: Multidetector CT imaging of the right ankle was performed according to the standard protocol. Multiplanar CT image reconstructions were also generated. RADIATION DOSE REDUCTION: This exam was performed according to the departmental dose-optimization program which includes automated exposure control, adjustment of the mA and/or kV according to patient size and/or use of iterative reconstruction technique. COMPARISON:  Ankle and foot radiograph performed earlier on the same date FINDINGS: Bones/Joint/Cartilage Oblique fracture through the distal fibula/lateral malleolus with approximately 1 shaft width posterior displacement and 1.1 cm overlap between the fracture fragment. There is also small avulsion fracture about the tip of the lateral malleolus. Displaced fracture of the posterior malleolus with approximately 6 mm posterior displacement of the major osseous fragment which measures approximately 2.2 x 0.7 cm. There is also displaced fracture of the medial malleolus with approximately 5 mm inferolateral displacement of the distal fracture fragment. There is also depressed fracture of the medial aspect of the talar dome with approximately 3 mm depression and multiple small intra-articular osseous fragments, the 2 large osseous fragments measuring 4 and 3 mm. There is tibiotalar translation with approximately  1.1 cm lateral and approximately 1 cm posterior dislocation of the talar dome relative to the tibial plafond. Ligaments Suboptimally assessed by CT. Muscles and Tendons Achilles tendon is intact. Extensor tendons are intact. Evaluation of flexor and peroneal tendons is somewhat limited due to marked edema. No intramuscular hematoma. Soft tissues Marked soft tissue swelling about the ankle. IMPRESSION: 1. Trimalleolar fracture with posterolateral translation at the tibiotalar joint as detailed above. 2. Depressed fracture of the medial aspect of the talar dome with at least two 3 and 4 mm intra-articular osseous fragments. 3. No evidence of tendon entrapment, there is however significant edema about the peroneal tendons, peroneal tendon injury can not be excluded. Electronically Signed   By: Larose Hires D.O.   On: 12/06/2021 18:33   CT 3D RECON AT SCANNER  Result Date: 12/06/2021 CLINICAL DATA:  Nonspecific (abnormal) findings on radiological and other examination of musculoskeletal system right ankle. EXAM: 3-DIMENSIONAL CT IMAGE RENDERING ON ACQUISITION WORKSTATION TECHNIQUE: 3-dimensional CT images were rendered by post-processing of the original CT data on an acquisition  workstation. The 3-dimensional CT images were interpreted and findings were reported in the accompanying complete CT report for this study COMPARISON:  X-ray right ankle 12/06/2021, CT right ankle 12/06/2021 FINDINGS: Redemonstration of an acute displaced trimalleolar fracture. Cortical irregularity along the talar dome consistent with known fracture. IMPRESSION: Acute trimalleolar and talar dome fracture. Electronically Signed   By: Tish FredericksonMorgane  Naveau M.D.   On: 12/06/2021 18:38   DG Ankle Right Port  Result Date: 12/07/2021 CLINICAL DATA:  Postop right ankle. EXAM: PORTABLE RIGHT ANKLE - 2 VIEW COMPARISON:  Preoperative imaging yesterday. FINDINGS: Plate and multi screw fixation with interfragmentary screws traversing distal fibular  fracture. Two screws traverse the medial malleolar fracture. Fractures are in improved alignment from preoperative imaging. The ankle mortise is now congruent. Talar dome irregularity is again seen. Overlying cast material in place. Lateral and medial skin staples in place. IMPRESSION: ORIF of distal fibular and tibial fractures, in improved alignment from preoperative imaging. Electronically Signed   By: Narda RutherfordMelanie  Sanford M.D.   On: 12/07/2021 18:13   DG Ankle Right Port  Result Date: 12/06/2021 CLINICAL DATA:  Status post reduction and casting EXAM: PORTABLE RIGHT ANKLE - 2 VIEW COMPARISON:  Films from earlier in the same day. FINDINGS: Trimalleolar fracture is again identified with slight decrease in the degree of fracture fragment displacement. The fracture involving the medial aspect of the talar dome is again noted. The talus remains somewhat posteriorly and laterally displaced. No new fracture seen. Splinting material is noted. IMPRESSION: Trimalleolar fracture with slight reduction following splinting. There remains some posterolateral displacement of the talus with respect to the distal tibia. Electronically Signed   By: Alcide CleverMark  Lukens M.D.   On: 12/06/2021 19:27   DG Foot 2 Views Right  Result Date: 12/06/2021 CLINICAL DATA:  Slipped and fell, twisting injury, pain and swelling EXAM: RIGHT FOOT - 2 VIEW; RIGHT KNEE - 1-2 VIEW; RIGHT ANKLE - 2 VIEW COMPARISON:  None. FINDINGS: Right knee: Frontal and lateral views demonstrate no fractures. Mild medial compartmental joint space narrowing. Trace joint effusion. Soft tissues are unremarkable. Right ankle: Frontal and cross-table lateral views of the right ankle are obtained. There is an oblique lateral malleolar fracture as well as a transverse medial malleolar fracture. Mild dorsal and lateral translation of the talus relative to the tibial plafond. I do not see any definite posterior malleolar fracture. Mild tibiotalar osteoarthritis. Diffuse soft tissue  swelling. Right foot: Frontal and lateral views are obtained. No acute fracture. Joint spaces are well preserved. Mild soft tissue swelling of the hindfoot. IMPRESSION: 1. Bimalleolar right ankle fracture, with slight dorsal and lateral translation of the talus relative to the tibial plafond. No evidence of dislocation. 2. Diffuse soft tissue swelling of the right ankle and hindfoot. 3. Mild medial compartmental right knee osteoarthritis and trace joint effusion. Electronically Signed   By: Sharlet SalinaMichael  Brown M.D.   On: 12/06/2021 16:38   DG MINI C-ARM IMAGE ONLY  Result Date: 12/07/2021 There is no interpretation for this exam.  This order is for images obtained during a surgical procedure.  Please See "Surgeries" Tab for more information regarding the procedure.    Assessment/Plan: 1 Day Post-Op   Principal Problem:   Closed right ankle fracture, initial encounter Active Problems:   SCHIZOPHRENIA, PARANOID, CHRONIC   Essential hypertension   Asthma   Closed trimalleolar fracture of right ankle   Impaired fasting glucose   Ankle fracture  Patient's pain is currently controlled at rest.  Continue current pain  management.  Patient will complete 24 hours of postop antibiotics with another dose of vancomycin.  Patient will begin Lovenox 40 mg daily today for DVT prophylaxis.  Patient lives in a group home and likely would benefit from a skilled nursing facility upon discharge.  Patient is nonweightbearing on the right lower extremity.  I reviewed with the patient the details of what was done during his surgery and explained the importance of remaining nonweightbearing on his right lower extremity.  Patient understood and agreed with this plan.    Juanell Fairly , MD 12/08/2021, 11:19 AM

## 2021-12-08 NOTE — Evaluation (Signed)
Physical Therapy Evaluation Patient Details Name: [name] [name] MRN: [id number] DOB: 1966/02/03 Today's Date: 12/08/2021  History of Present Illness  Pt is a 56 y.o. male who presented to the emergency department at [facility] today after a fall in his front yard when he slipped on mud. Pt diagnosed with a R bimalleolar ankle fracture and is s/p ORIF. PMH includes HTN, paranoid schizophrenia, and depression.   Clinical Impression  Pt was pleasant and motivated to participate during the session and put forth good effort throughout but ultimately was very weak functionally.  Pt required extensive time, effort, cuing for sequencing, and min A for RLE management during bed mobility training.  Extensive education and practice of proper sequencing during attempted sit to stand training with pt unable to clear the surface of the bed even with max A and elevated bed. Pt's R foot placed on top of this PT's foot to ensure WB compliance during training with pt unable to prevent putting weight through his RLE without almost constant cuing.  Pt would not be safe to return to his prior living situation and is at high risk for non-compliance with WB status if he were to do so.  Pt will benefit from PT services in a SNF setting upon discharge to safely address deficits listed in patient problem list for decreased caregiver assistance and eventual return to PLOF.         Recommendations for follow up therapy are one component of a multi-disciplinary discharge planning process, led by the attending physician.  Recommendations may be updated based on patient status, additional functional criteria and insurance authorization.  Follow Up Recommendations Skilled nursing-short term rehab (<3 hours/day)    Assistance Recommended at Discharge Frequent or constant Supervision/Assistance  Patient can return home with the following  Two people to help with walking and/or transfers;Two people to help with  bathing/dressing/bathroom;Assistance with cooking/housework;Direct supervision/assist for medications management;Direct supervision/assist for financial management;Assist for transportation    Equipment Recommendations Other (comment) (TBD at next venue of care (pt currently has SPC only))  Recommendations for Other Services       Functional Status Assessment Patient has had a recent decline in their functional status and demonstrates the ability to make significant improvements in function in a reasonable and predictable amount of time.     Precautions / Restrictions Precautions Precautions: Fall Restrictions Weight Bearing Restrictions: Yes RLE Weight Bearing: Non weight bearing      Mobility  Bed Mobility Overal bed mobility: Needs Assistance Bed Mobility: Supine to Sit, Sit to Supine     Supine to sit: Min assist Sit to supine: Min assist   General bed mobility comments: Min A to manage the RLE with extensive extra time and cuing for sequencing    Transfers Overall transfer level: Needs assistance                 General transfer comment: Pt unable to clear the surface of the bed during multiple attempts with elevated EOB and max A provided; pt's R foot placed on top of this PT foot to monitor for WB compliance with pt requiring max cuing during attempts to keep weight off of the RLE    Ambulation/Gait               General Gait Details: unable/unsafe to attempt  Stairs            Wheelchair Mobility    Modified Rankin (Stroke Patients Only)       Balance  Overall balance assessment: Needs assistance   Sitting balance-Leahy Scale: Fair         Standing balance comment: Unable to come to standing                             Pertinent Vitals/Pain Pain Assessment Pain Assessment: No/denies pain    Home Living Family/patient expects to be discharged to:: Group home                   Additional Comments: Level  entry, owns a [employer], 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    Prior Function Prior Level of Function : Independent/Modified Independent             Mobility Comments: Ind with amb without an AD community distances, no other fall history ADLs Comments: Ind with ADLs     Hand Dominance        Extremity/Trunk Assessment   Upper Extremity Assessment Upper Extremity Assessment: Generalized weakness    Lower Extremity Assessment Lower Extremity Assessment: Generalized weakness;RLE deficits/detail RLE: Unable to fully assess due to immobilization RLE Sensation: WNL       Communication   Communication: No difficulties  Cognition Arousal/Alertness: Awake/alert Behavior During Therapy: WFL for tasks assessed/performed Overall Cognitive Status: No family/caregiver present to determine baseline cognitive functioning                                 General Comments: Pt oriented to self, location, and situation; increased time and cuing needed to follow commands        General Comments      Exercises Total Joint Exercises Ankle Circles/Pumps: AROM, Strengthening, Left, 10 reps Hip ABduction/ADduction: AAROM, Strengthening, Right, 10 reps Straight Leg Raises: AAROM, Strengthening, Right, 10 reps Long Arc Quad: AROM, Strengthening, Both, 5 reps, 10 reps Knee Flexion: AROM, Strengthening, Both, 5 reps, 10 reps Marching in Standing: AROM, Strengthening, Both, 5 reps, Seated  Extensive education on WB compliance and sequencing with functional activities to ensure NWB through the RLE   Assessment/Plan    PT Assessment Patient needs continued PT services  PT Problem List Decreased strength;Decreased activity tolerance;Decreased balance;Decreased mobility;Decreased knowledge of use of DME;Decreased knowledge of precautions;Decreased safety awareness       PT Treatment Interventions DME instruction;Gait training;Functional mobility training;Therapeutic activities;Therapeutic  exercise;Balance training;Patient/family education    PT Goals (Current goals can be found in the Care Plan section)  Acute Rehab PT Goals Patient Stated Goal: To walk normal again PT Goal Formulation: With patient Time For Goal Achievement: 12/21/21 Potential to Achieve Goals: Good    Frequency 7X/week     Co-evaluation               AM-PAC PT "6 Clicks" Mobility  Outcome Measure Help needed turning from your back to your side while in a flat bed without using bedrails?: A Little Help needed moving from lying on your back to sitting on the side of a flat bed without using bedrails?: A Little Help needed moving to and from a bed to a chair (including a wheelchair)?: Total Help needed standing up from a chair using your arms (e.g., wheelchair or bedside chair)?: Total Help needed to walk in hospital room?: Total Help needed climbing 3-5 steps with a railing? : Total 6 Click Score: 10    End of Session Equipment Utilized During Treatment: Gait belt Activity Tolerance: Patient tolerated  treatment well Patient left: in bed;with nursing/sitter in room (Pt left sitting EOB with CNA for bathing) Nurse Communication: Mobility status;Weight bearing status;Other (comment) (Nursing gave ok for trial on room air with results communicated to nursing and that pt left on room air at end of session) PT Visit Diagnosis: Unsteadiness on feet (R26.81);History of falling (Z91.81);Other abnormalities of gait and mobility (R26.89);Muscle weakness (generalized) (M62.81)    Time: [id number] PT Time Calculation (min) (ACUTE ONLY): 34 min   Charges:   PT Evaluation $PT Eval Moderate Complexity: 1 Mod PT Treatments $Therapeutic Exercise: 8-22 mins $Therapeutic Activity: 8-22 mins       D. Scott Emunah Texidor PT, DPT 12/08/21, 11:51 AM

## 2021-12-09 DIAGNOSIS — I10 Essential (primary) hypertension: Secondary | ICD-10-CM | POA: Diagnosis not present

## 2021-12-09 DIAGNOSIS — S82851S Displaced trimalleolar fracture of right lower leg, sequela: Secondary | ICD-10-CM | POA: Diagnosis not present

## 2021-12-09 DIAGNOSIS — E860 Dehydration: Secondary | ICD-10-CM | POA: Diagnosis not present

## 2021-12-09 DIAGNOSIS — R7301 Impaired fasting glucose: Secondary | ICD-10-CM | POA: Diagnosis not present

## 2021-12-09 LAB — BASIC METABOLIC PANEL
Anion gap: 5 (ref 5–15)
BUN: 21 mg/dL — ABNORMAL HIGH (ref 6–20)
CO2: 29 mmol/L (ref 22–32)
Calcium: 8.3 mg/dL — ABNORMAL LOW (ref 8.9–10.3)
Chloride: 107 mmol/L (ref 98–111)
Creatinine, Ser: 1.34 mg/dL — ABNORMAL HIGH (ref 0.61–1.24)
GFR, Estimated: >60 mL/min (ref >60)
Glucose, Bld: 142 mg/dL — ABNORMAL HIGH (ref 70–99)
Potassium: 4 mmol/L (ref 3.5–5.1)
Sodium: 141 mmol/L (ref 135–145)

## 2021-12-09 MED ORDER — IPRATROPIUM-ALBUTEROL 0.5-2.5 (3) MG/3ML IN SOLN
3.0000 mL | Freq: Four times a day (QID) | RESPIRATORY_TRACT | Status: DC
  Administered 2021-12-09 – 2021-12-10 (×7): 3 mL via RESPIRATORY_TRACT
  Filled 2021-12-09 (×7): qty 3

## 2021-12-09 NOTE — Progress Notes (Signed)
Physical Therapy Treatment Patient Details Name: [name] [name] MRN: [id number] DOB: 1966/04/27 Today's Date: 12/09/2021   History of Present Illness Pt is a 56 y.o. male who presented to the emergency department at [facility] today after a fall in his front yard when he slipped on mud. Pt diagnosed with a R bimalleolar ankle fracture and is s/p ORIF. PMH includes HTN, paranoid schizophrenia, and depression.    PT Comments    Pt in bed, agrees to session.  Participated in exercises as described below.  He is able to transition to EOB with increased time and min/mod a x 1 to get fully to edge.  Steady in sitting but he is unable to coordinate movements to lateral scoot in bed in preparation for lateral scoots to chair.  He is given increased time and cues.  He is able to transition back to supine with cues and assist with scooting up to [facility] in supine.  Positioned for comfort.    Pt is unable to maintain NWB with transfer  and standing.  He is appropriate for [facility] type lift transfer for OOB activities.   Recommendations for follow up therapy are one component of a multi-disciplinary discharge planning process, led by the attending physician.  Recommendations may be updated based on patient status, additional functional criteria and insurance authorization.  Follow Up Recommendations  Skilled nursing-short term rehab (<3 hours/day)     Assistance Recommended at Discharge Frequent or constant Supervision/Assistance  Patient can return home with the following Two people to help with walking and/or transfers;Two people to help with bathing/dressing/bathroom;Assistance with cooking/housework;Direct supervision/assist for medications management;Direct supervision/assist for financial management;Assist for transportation   Equipment Recommendations  Other (comment) (TBD at next venue of care (pt currently has SPC only))    Recommendations for Other Services       Precautions / Restrictions  Precautions Precautions: Fall Restrictions Weight Bearing Restrictions: Yes RLE Weight Bearing: Non weight bearing     Mobility  Bed Mobility Overal bed mobility: Needs Assistance Bed Mobility: Supine to Sit, Sit to Supine     Supine to sit: Min assist, Mod assist Sit to supine: Min assist   General bed mobility comments: Min A to manage the RLE with extensive extra time and cuing for sequencing    Transfers Overall transfer level: Needs assistance Equipment used: None Transfers:  (lateral scoot) Sit to Stand: Total assist           General transfer comment: attempted lateral scoot with increased time but unable to coordinate movements.    Ambulation/Gait               General Gait Details: unable/unsafe to attempt   Stairs             Wheelchair Mobility    Modified Rankin (Stroke Patients Only)       Balance Overall balance assessment: Needs assistance Sitting-balance support: Feet supported Sitting balance-Leahy Scale: Fair         Standing balance comment: Unable to come to standing                            Cognition Arousal/Alertness: Awake/alert Behavior During Therapy: WFL for tasks assessed/performed Overall Cognitive Status: No family/caregiver present to determine baseline cognitive functioning  Exercises Total Joint Exercises Ankle Circles/Pumps: AROM, Strengthening, Left, 10 reps Hip ABduction/ADduction: AAROM, Strengthening, Right, 10 reps Straight Leg Raises: AAROM, Strengthening, Right, 10 reps Long Arc Quad: AROM, Strengthening, Both, 5 reps, 10 reps Knee Flexion: AROM, Strengthening, Both, 5 reps, 10 reps Marching in Standing: Both, 5 reps, Seated, Strengthening, AROM    General Comments        Pertinent Vitals/Pain Pain Assessment Pain Assessment: Faces Faces Pain Scale: Hurts little more Pain Descriptors / Indicators: Aching, Sore Pain  Intervention(s): Limited activity within patient's tolerance, Monitored during session, Repositioned    Home Living                          Prior Function            PT Goals (current goals can now be found in the care plan section) Progress towards PT goals: Progressing toward goals    Frequency    7X/week      PT Plan      Co-evaluation              AM-PAC PT "6 Clicks" Mobility   Outcome Measure  Help needed turning from your back to your side while in a flat bed without using bedrails?: A Little Help needed moving from lying on your back to sitting on the side of a flat bed without using bedrails?: A Little Help needed moving to and from a bed to a chair (including a wheelchair)?: Total Help needed standing up from a chair using your arms (e.g., wheelchair or bedside chair)?: Total Help needed to walk in hospital room?: Total Help needed climbing 3-5 steps with a railing? : Total 6 Click Score: 10    End of Session Equipment Utilized During Treatment: Gait belt Activity Tolerance: Patient tolerated treatment well Patient left: in bed;with nursing/sitter in room (Pt left sitting EOB with CNA for bathing) Nurse Communication: Mobility status;Weight bearing status;Other (comment) (Nursing gave ok for trial on room air with results communicated to nursing and that pt left on room air at end of session) PT Visit Diagnosis: Unsteadiness on feet (R26.81);History of falling (Z91.81);Other abnormalities of gait and mobility (R26.89);Muscle weakness (generalized) (M62.81)     Time: 5009-3818 PT Time Calculation (min) (ACUTE ONLY): 24 min  Charges:  $Therapeutic Exercise: 8-22 mins $Therapeutic Activity: 8-22 mins                    Danielle Dess, PTA 12/09/21, 1:07 PM

## 2021-12-09 NOTE — Progress Notes (Signed)
°  Progress Note   Patient: [name] [name] NTI:[id number] DOB: [birth year] DOA: 12/06/2021     1 DOS: the patient was seen and examined on 12/09/2021    Assessment and Plan: * Closed trimalleolar fracture of right ankle Dr. Martha Clan took to the operating room on 12/08/2021 for ORIF.  Pain control.  Physical therapy recommending rehab.  Patient nonweightbearing right ankle.  Dehydration Patient's creatinine still 1.34.  Initial creatinine of 1.09.  Gentle fluids for now.  Essential hypertension- (present on admission) Continue Toprol-XL.  Impaired fasting glucose Hemoglobin A1c 5.7.  Patient is not a diabetic.  Asthma- (present on admission) Add nebulizer treatments.  Able to come off oxygen  SCHIZOPHRENIA, PARANOID, CHRONIC- (present on admission) Continue Depakote, Cogentin, and Invega        Subjective: Patient feeling okay.  Offers no complaints.  Some discomfort in his ankle.  Little bit of cough.  Physical Exam: Vitals:   12/08/21 1937 12/09/21 0331 12/09/21 0741 12/09/21 1140  BP: 126/77 125/77 115/70 (!) 149/94  Pulse: (!) 107 100 98 (!) 101  Resp: 16 18 18 19   Temp: 98.5 F (36.9 C) 98.8 F (37.1 C) 99.5 F (37.5 C) 98.2 F (36.8 C)  TempSrc:  Oral Oral   SpO2: 93% 95% 94% 95%  Weight:      Height:       Physical Exam HENT:     Head: Normocephalic.     Mouth/Throat:     Pharynx: No oropharyngeal exudate.  Eyes:     General: Lids are normal.     Conjunctiva/sclera: Conjunctivae normal.  Cardiovascular:     Rate and Rhythm: Normal rate and regular rhythm.     Heart sounds: Normal heart sounds, S1 normal and S2 normal.  Pulmonary:     Breath sounds: No decreased breath sounds, wheezing, rhonchi or rales.  Abdominal:     Palpations: Abdomen is soft.     Tenderness: There is no abdominal tenderness.  Musculoskeletal:     Left lower leg: No swelling.     Comments: Right ankle wrapped with Ace bandage.  Skin:    General: Skin is warm.     Findings: No  rash.  Neurological:     Mental Status: He is alert and oriented to person, place, and time.     Comments: Patient able to wiggle toes bilaterally.     Data Reviewed: Creatinine is the same today at 1.34  Family Communication: Spoke with patient's mother yesterday  Disposition: Status is: Inpatient Remains inpatient appropriate because: Patient is awaiting rehab  Planned Discharge Destination: Rehab  Author: , MD 12/09/2021 12:24 PM  For on call review [email or web address].

## 2021-12-09 NOTE — Evaluation (Signed)
Occupational Therapy Evaluation Patient Details Name: [name] [name] MRN: [id number] DOB: 1966/05/12 Today's Date: 12/09/2021   History of Present Illness Pt is a 56 y.o. male who presented to the emergency department at [facility] today after a fall in his front yard when he slipped on mud. Pt diagnosed with a R bimalleolar ankle fracture and is s/p ORIF. PMH includes HTN, paranoid schizophrenia, and depression.   Clinical Impression   [name] [name] presents with generalized weakness, limited endurance, and RLE pain. PTA, pt lived in a group home, where he was IND in fxl mobility, ambulating without AD, with staff members of group home providing transportation, shopping, cooking, cleaning. Pt denies falls history, other than fall that precipitated this hospitalization. Today pt requires Mod A and greatly increased time and effort for bed mobility, is unable to come into standing. He does recall that he is not to bear weight through R LE, but cannot clear buttocks off bed using only LLE, even with Max A and repeated cueing. At this point pt will require a hoyer lift for OOB activity. Recommend ongoing OT while hospitalized, with DC to STR until pt is able to ambulate while adhering to precautions, or until WB precautions are lifted.   Recommendations for follow up therapy are one component of a multi-disciplinary discharge planning process, led by the attending physician.  Recommendations may be updated based on patient status, additional functional criteria and insurance authorization.   Follow Up Recommendations  Skilled nursing-short term rehab (<3 hours/day)    Assistance Recommended at Discharge Frequent or constant Supervision/Assistance  Patient can return home with the following A lot of help with walking and/or transfers;A lot of help with bathing/dressing/bathroom;Assist for transportation;Assistance with cooking/housework;Direct supervision/assist for medications management    Functional  Status Assessment  Patient has had a recent decline in their functional status and demonstrates the ability to make significant improvements in function in a reasonable and predictable amount of time.  Equipment Recommendations  Other (comment) (Hoyer lift for transfers at present)    Recommendations for Other Services       Precautions / Restrictions Precautions Precautions: Fall Restrictions Weight Bearing Restrictions: Yes RLE Weight Bearing: Non weight bearing      Mobility Bed Mobility Overal bed mobility: Needs Assistance Bed Mobility: Supine to Sit, Sit to Supine     Supine to sit: Mod assist Sit to supine: Min assist   General bed mobility comments: Mod A for mobilizing RLE, increased time/effort, rest breaks required    Transfers Overall transfer level: Needs assistance     Sit to Stand: Total assist           General transfer comment: Pt unablet to scoot or stand while adhering to WB orders      Balance Overall balance assessment: Needs assistance Sitting-balance support: Feet supported Sitting balance-Leahy Scale: Fair       Standing balance-Leahy Scale: Zero Standing balance comment: Unable to come to standing                           ADL either performed or assessed with clinical judgement   ADL Overall ADL's : Needs assistance/impaired Eating/Feeding: Modified independent;Set up           Lower Body Bathing: Maximal assistance       Lower Body Dressing: Maximal assistance   Toilet Transfer: Total assistance       Tub/ Shower Transfer: Maximal assistance  Vision Patient Visual Report: No change from baseline       Perception     Praxis      Pertinent Vitals/Pain Pain Assessment Pain Assessment: No/denies pain Faces Pain Scale: Hurts even more Pain Location: R ankle Pain Descriptors / Indicators: Aching, Guarding Pain Intervention(s): Monitored during session, Limited activity within patient's  tolerance, Repositioned, Patient requesting pain meds-RN notified     Hand Dominance     Extremity/Trunk Assessment Upper Extremity Assessment Upper Extremity Assessment: Generalized weakness   Lower Extremity Assessment Lower Extremity Assessment: Generalized weakness;RLE deficits/detail RLE: Unable to fully assess due to immobilization RLE Sensation: WNL       Communication Communication Communication: No difficulties   Cognition Arousal/Alertness: Awake/alert Behavior During Therapy: WFL for tasks assessed/performed Overall Cognitive Status: No family/caregiver present to determine baseline cognitive functioning                                 General Comments: Pt oriented to self, location, and situation; increased time and cuing needed to follow commands     General Comments       Exercises Other Exercises Other Exercises: Educ re: WB precautions, DC recs   Shoulder Instructions      Home Living Family/patient expects to be discharged to:: Group home                                 Additional Comments: Level entry, 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[facility], 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      Prior Functioning/Environment Prior Level of Function : Independent/Modified Independent             Mobility Comments: Ind with amb without an AD community distances, no other fall history ADLs Comments: Ind with ADLs        OT Problem List: Decreased strength;Impaired balance (sitting and/or standing);Pain;Decreased range of motion;Decreased activity tolerance;Obesity      OT Treatment/Interventions: Self-care/ADL training;DME and/or AE instruction;Therapeutic activities;Balance training;Therapeutic exercise;Patient/family education;Energy conservation    OT Goals(Current goals can be found in the care plan section) Acute Rehab OT Goals Patient Stated Goal: to not have pain in ankle OT Goal Formulation: With patient Time For Goal Achievement: 12/23/21 Potential to  Achieve Goals: Good ADL Goals Pt Will Perform Grooming: with supervision;standing (while maintaining WB precautions) Pt Will Perform Lower Body Dressing: with supervision;sitting/lateral leans;sit to/from stand (using AE as needed, while adhering to WB precautions) Pt Will Transfer to Toilet: with supervision;stand pivot transfer (using LRAD, and adhering to WB precautions)  OT Frequency: Min 2X/week    Co-evaluation              AM-PAC OT "6 Clicks" Daily Activity     Outcome Measure   Help from another person taking care of personal grooming?: A Little Help from another person toileting, which includes using toliet, bedpan, or urinal?: Total Help from another person bathing (including washing, rinsing, drying)?: A Lot Help from another person to put on and taking off regular upper body clothing?: A Little Help from another person to put on and taking off regular lower body clothing?: A Lot 6 Click Score: 11   End of Session Nurse Communication: Patient requests pain meds  Activity Tolerance: Other (comment) (Pt limited by WB restrictions) Patient left: in bed;with call bell/phone within reach;with bed alarm set  OT Visit Diagnosis: Other abnormalities of gait and mobility (R26.89);Muscle weakness (  generalized) (M62.81);Pain Pain - Right/Left: Right Pain - part of body: Leg                Time: 6195-0932 OT Time Calculation (min): 15 min Charges:  OT General Charges $OT Visit: 1 Visit OT Evaluation $OT Eval Moderate Complexity: 1 Mod OT Treatments $Self Care/Home Management : 8-22 mins [name] Craver, PhD, MS, OTR/L 12/09/21, 2:46 PM

## 2021-12-09 NOTE — Progress Notes (Signed)
°  Subjective:  Patient reports pain as mild.  Physical therapy in room and he is at the side of the bed.  Objective:   VITALS:   Vitals:   12/08/21 1536 12/08/21 1937 12/09/21 0331 12/09/21 0741  BP: 122/68 126/77 125/77 115/70  Pulse: 96 (!) 107 100 98  Resp: 16 16 18 18   Temp: 98.5 F (36.9 C) 98.5 F (36.9 C) 98.8 F (37.1 C) 99.5 F (37.5 C)  TempSrc:   Oral Oral  SpO2: 97% 93% 95% 94%  Weight:      Height:        PHYSICAL EXAM:  ABD soft Sensation intact distally Incision: dressing C/D/I Compartment soft  LABS  Results for orders placed or performed during the hospital encounter of 12/06/21 (from the past 24 hour(s))  Basic metabolic panel     Status: Abnormal   Collection Time: 12/09/21  4:41 AM  Result Value Ref Range   Sodium 141 135 - 145 mmol/L   Potassium 4.0 3.5 - 5.1 mmol/L   Chloride 107 98 - 111 mmol/L   CO2 29 22 - 32 mmol/L   Glucose, Bld 142 (H) 70 - 99 mg/dL   BUN 21 (H) 6 - 20 mg/dL   Creatinine, Ser 02/06/22 (H) 0.61 - 1.24 mg/dL   Calcium 8.3 (L) 8.9 - 10.3 mg/dL   GFR, Estimated 1.54 >00 mL/min   Anion gap 5 5 - 15    DG Ankle Right Port  Result Date: 12/07/2021 CLINICAL DATA:  Postop right ankle. EXAM: PORTABLE RIGHT ANKLE - 2 VIEW COMPARISON:  Preoperative imaging yesterday. FINDINGS: Plate and multi screw fixation with interfragmentary screws traversing distal fibular fracture. Two screws traverse the medial malleolar fracture. Fractures are in improved alignment from preoperative imaging. The ankle mortise is now congruent. Talar dome irregularity is again seen. Overlying cast material in place. Lateral and medial skin staples in place. IMPRESSION: ORIF of distal fibular and tibial fractures, in improved alignment from preoperative imaging. Electronically Signed   By: 02/04/2022 M.D.   On: 12/07/2021 18:13   DG MINI C-ARM IMAGE ONLY  Result Date: 12/07/2021 There is no interpretation for this exam.  This order is for images obtained  during a surgical procedure.  Please See "Surgeries" Tab for more information regarding the procedure.    Assessment/Plan: 2 Days Post-Op   Principal Problem:   Closed trimalleolar fracture of right ankle Active Problems:   SCHIZOPHRENIA, PARANOID, CHRONIC   Essential hypertension   Asthma   Impaired fasting glucose   Ankle fracture   Dehydration   Up with therapy NWB on the right leg  Lovenox 40 mg daily today for DVT prophylaxis.    Discharge to SNF planning    02/04/2022 , MD 12/09/2021, 11:34 AM

## 2021-12-09 NOTE — TOC Initial Note (Addendum)
Transition of Care [facility]) - Initial/Assessment Note    Patient Details  Name: [name] [name] MRN: [id number] Date of Birth: 1966/03/11  [facility]) CM/SW Contact:    Elliot Gurney [place], [place] Phone Number: 12/09/2021, 2:14 PM  Clinical Narrative:                 This Education officer, museum met with patient at bedside to discuss discharge recommendations. Spoke with patient's mother Valentino Saxon by phone [phone]. Per patient's mother, she is patient's [place] guardian, however patient has been living in a group home since 2019 under the care of Damien Fusi [phone]. VM left for [facility] requesting a return call. Patient and patient's mother agreeable to rehab stay and will be able to return to the group home following his treatment. Patient's mother has no preferences in terms of facilities. Fl2 completed by [facility] team member.  Fl2 will need to be signed to submit for level II pasrr review.  12/09/21 Phone call to group home owner Damien Fusi [phone] dicussed  rehab recommendation. Ms. Pamella Pert concerned that patient may show reluctance in regards to rehab, however at this time he is agreeable. Confirmed that patient will be able to return to the group home following his rehab.  Transition of Care to continue to follow    [facility], LCSW Transition of Care [phone]   Expected Discharge Plan: [place] Barriers to Discharge: Continued Medical Work up   Patient Goals and CMS Choice Patient states their goals for this hospitalization and ongoing recovery are:: "I want to walk better{   Choice offered to / list presented to : Parent (discussed options by phone)  Expected Discharge Plan and Services Expected Discharge Plan: [place] In-house Referral: Clinical Social Work     Living arrangements for the past 2 months: Group Home                                      Prior Living Arrangements/Services Living arrangements  for the past 2 months: Group Home Lives with:: Facility Resident Patient language and need for interpreter reviewed:: Yes Do you feel safe going back to the place where you live?: Yes      Need for Family Participation in Patient Care: Yes (Comment) Care giver support system in place?: Yes (comment)   Criminal Activity/Legal Involvement Pertinent to Current Situation/Hospitalization: No - Comment as needed  Activities of Daily Living Home Assistive Devices/Equipment: None ADL Screening (condition at time of admission) Patient's cognitive ability adequate to safely complete daily activities?: Yes Is the patient deaf or have difficulty hearing?: No Does the patient have difficulty seeing, even when wearing glasses/contacts?: No Does the patient have difficulty concentrating, remembering, or making decisions?: No Patient able to express need for assistance with ADLs?: Yes Does the patient have difficulty dressing or bathing?: No Independently performs ADLs?: Yes (appropriate for developmental age) Does the patient have difficulty walking or climbing stairs?: No Weakness of Legs: Right Weakness of Arms/Hands: Left  Permission Sought/Granted            Permission granted to share info w Relationship: Valentino Saxon [phone]  Permission granted to share info w Contact Information: [phone] [facility] owner, Valentino Saxon mother [phone]  Emotional Assessment Appearance:: Appears stated age Attitude/Demeanor/Rapport: Engaged   Orientation: : Oriented to Self, Oriented to Place, Oriented to Situation, Oriented to  Time Alcohol / Substance  Use: Not Applicable Psych Involvement: No (comment)  Admission diagnosis:  Fracture [T14.8XXA] Ankle fracture [S82.899A] Fracture in accidental fall [T14.8XXA, [id number]] Closed bimalleolar fracture of right ankle, initial encounter [S82.841A] Bimalleolar ankle fracture, right, closed, initial encounter [N86.767M] Patient  Active Problem List   Diagnosis Date Noted   Ankle fracture 12/08/2021   Dehydration 12/08/2021   Impaired fasting glucose 12/07/2021   Closed trimalleolar fracture of right ankle 12/06/2021   ACUTE BRONCHITIS 01/24/2009   SCHIZOPHRENIA, PARANOID, CHRONIC 04/27/2007   DEPRESSION 04/23/2007   Essential hypertension 04/23/2007   Asthma 04/23/2007   PCP:  Lucianne Lei, MD Pharmacy:   [facility], [address] [place] [address] Phone: [phone] Fax: [phone]     Social Determinants of Health (SDOH) Interventions    Readmission Risk Interventions No flowsheet data found.

## 2021-12-09 NOTE — [place] FL2 (Signed)
[place] MEDICAID FL2 LEVEL OF CARE SCREENING TOOL     IDENTIFICATION  Patient Name: [name] [name] Birthdate: [birth year] Sex: male Admission Date (Current Location): 12/06/2021  [facility] and [place] Number:  Chiropodist and Address:  [facility], [address] [place], [place] [address]      Provider Number: [id number]  Attending Physician Name and Address:  Juanell Fairly, MD  Relative Name and Phone Number:       Current Level of Care: Hospital Recommended Level of Care: Skilled Nursing Facility Prior Approval Number:    Date Approved/Denied:   PASRR Number:    Discharge Plan: SNF    Current Diagnoses: Patient Active Problem List   Diagnosis Date Noted   Ankle fracture 12/08/2021   Dehydration 12/08/2021   Impaired fasting glucose 12/07/2021   Closed trimalleolar fracture of right ankle 12/06/2021   ACUTE BRONCHITIS 01/24/2009   SCHIZOPHRENIA, PARANOID, CHRONIC 04/27/2007   DEPRESSION 04/23/2007   Essential hypertension 04/23/2007   Asthma 04/23/2007    Orientation RESPIRATION BLADDER Height & Weight     Self  Normal Continent Weight: 250 lb 14.1 oz (113.8 kg) Height:  6\' 2"  (188 cm)  BEHAVIORAL SYMPTOMS/MOOD NEUROLOGICAL BOWEL NUTRITION STATUS      Continent Diet (regular)  AMBULATORY STATUS COMMUNICATION OF NEEDS Skin   Extensive Assist Verbally Surgical wounds (closed incision right ankle)                       Personal Care Assistance Level of Assistance  Bathing, Feeding, Dressing Bathing Assistance: Limited assistance Feeding assistance: Independent Dressing Assistance: Limited assistance     Functional Limitations Info  Sight, Hearing, Speech Sight Info: Adequate Hearing Info: Adequate Speech Info: Adequate    SPECIAL CARE FACTORS FREQUENCY  PT (By licensed PT), OT (By licensed OT)     PT Frequency: 5x OT Frequency: 5x            Contractures Contractures Info: Not present     Additional Factors Info  Code Status, Allergies Code Status Info: full code Allergies Info: Cefprozil, Cetirizine Hcl, Doxycycline           Current Medications (12/09/2021):  This is the current hospital active medication list Current Facility-Administered Medications  Medication Dose Route Frequency Provider Last Rate Last Admin   benztropine (COGENTIN) tablet 1 mg  1 mg Oral TID 02/06/2022, MD   1 mg at 12/09/21 0913   bisacodyl (DULCOLAX) EC tablet 5 mg  5 mg Oral Daily PRN 02/06/22, MD       divalproex (DEPAKOTE ER) 24 hr tablet 2,000 mg  2,000 mg Oral QHS Juanell Fairly V, MD   2,000 mg at 12/08/21 2224   docusate sodium (COLACE) capsule 100 mg  100 mg Oral BID 2225, MD   100 mg at 12/09/21 0913   enoxaparin (LOVENOX) injection 40 mg  40 mg Subcutaneous Daily 02/06/22, MD   40 mg at 12/09/21 0914   fluticasone furoate-vilanterol (BREO ELLIPTA) 200-25 MCG/ACT 1 puff  1 puff Inhalation Daily 02/06/22, MD   1 puff at 12/09/21 0914   ipratropium-albuterol (DUONEB) 0.5-2.5 (3) MG/3ML nebulizer solution 3 mL  3 mL Nebulization Q6H Wieting, Richard, MD   3 mL at 12/09/21 1305   methocarbamol (ROBAXIN) tablet 500 mg  500 mg Oral Q6H PRN 02/06/22, MD       Or   methocarbamol (ROBAXIN) 500 mg in dextrose 5 %  50 mL IVPB  500 mg Intravenous Q6H PRN Juanell Fairly, MD       metoprolol succinate (TOPROL-XL) 24 hr tablet 50 mg  50 mg Oral Daily Irena Cords V, MD   50 mg at 12/09/21 0913   morphine (PF) 2 MG/ML injection 2 mg  2 mg Intravenous Q2H PRN Juanell Fairly, MD   2 mg at 12/08/21 2233   mupirocin ointment (BACTROBAN) 2 % 1 application  1 application Nasal BID Juanell Fairly, MD   1 application at 12/09/21 0915   oxyCODONE (Oxy IR/ROXICODONE) immediate release tablet 5-10 mg  5-10 mg Oral Q4H PRN Juanell Fairly, MD   10 mg at 12/06/21 2012   paliperidone (INVEGA) 24 hr tablet 6 mg  6 mg Oral QHS Irena Cords V, MD   6 mg at 12/08/21 2224    pantoprazole (PROTONIX) EC tablet 40 mg  40 mg Oral Daily Gertha Calkin, MD   40 mg at 12/09/21 0913   senna-docusate (Senokot-S) tablet 1 tablet  1 tablet Oral QHS PRN Juanell Fairly, MD       sodium phosphate (FLEET) 7-19 GM/118ML enema 1 enema  1 enema Rectal Once PRN Juanell Fairly, MD       vancomycin (VANCOCIN) IVPB 1000 mg/200 mL premix  1,000 mg Intravenous 60 min Pre-Op Aleda Grana, [facility]         Discharge Medications: Please see discharge summary for a list of discharge medications.  Relevant Imaging Results:  Relevant Lab Results:   Additional Information SSN:[id number]  Graig Hessling E Durand Wittmeyer, LCSW

## 2021-12-10 ENCOUNTER — Encounter: Payer: Self-pay | Admitting: Orthopedic Surgery

## 2021-12-10 DIAGNOSIS — E669 Obesity, unspecified: Secondary | ICD-10-CM

## 2021-12-10 LAB — BASIC METABOLIC PANEL
Anion gap: 4 — ABNORMAL LOW (ref 5–15)
BUN: 19 mg/dL (ref 6–20)
CO2: 30 mmol/L (ref 22–32)
Calcium: 8.5 mg/dL — ABNORMAL LOW (ref 8.9–10.3)
Chloride: 105 mmol/L (ref 98–111)
Creatinine, Ser: 1.1 mg/dL (ref 0.61–1.24)
GFR, Estimated: >60 mL/min (ref >60)
Glucose, Bld: 101 mg/dL — ABNORMAL HIGH (ref 70–99)
Potassium: 3.9 mmol/L (ref 3.5–5.1)
Sodium: 139 mmol/L (ref 135–145)

## 2021-12-10 MED ORDER — IPRATROPIUM-ALBUTEROL 0.5-2.5 (3) MG/3ML IN SOLN
3.0000 mL | Freq: Three times a day (TID) | RESPIRATORY_TRACT | Status: DC
  Administered 2021-12-11 – 2021-12-12 (×4): 3 mL via RESPIRATORY_TRACT
  Filled 2021-12-10 (×4): qty 3

## 2021-12-10 NOTE — TOC Progression Note (Addendum)
Transition of Care [facility]) - Progression Note    Patient Details  Name: [name] [name] MRN: [id number] Date of Birth: 1966/03/04  Transition of Care [facility]) CM/SW Contact  Marlowe Sax, RN Phone Number: 12/10/2021, 12:47 PM  Clinical Narrative:   Spoke with the patient and called his mother his legal guardian, reviewed the bed offers, I explained that we have to get Ins approval, accepted bed offer in [facility], Called [facility], to get Ins approval Left a secure VM for Junious Dresser requesting to start Ins approval, and provided with the Dr name, Facility and ICD10 code   Expected Discharge Plan: Skilled Nursing Facility Barriers to Discharge: Continued Medical Work up  Expected Discharge Plan and Services Expected Discharge Plan: Skilled Nursing Facility In-house Referral: Clinical Social Work     Living arrangements for the past 2 months: Group Home                                       Social Determinants of Health (SDOH) Interventions    Readmission Risk Interventions No flowsheet data found.

## 2021-12-10 NOTE — Progress Notes (Signed)
°  Progress Note   Patient: [name] [name] [id number] DOB: 11/28/1965 DOA: 12/06/2021     2 DOS: the patient was seen and examined on 12/10/2021     Assessment and Plan: * Closed trimalleolar fracture of right ankle Dr. Mack Guise took to the operating room on 12/08/2021 for ORIF.  Pain control.  Physical therapy recommending rehab.  Patient nonweightbearing right ankle.  On DVT prophylaxis with Lovenox.  Awaiting insurance authorization for rehab  Dehydration Patient's creatinine went up to 1.34 now back to 1.10.  Fluids discontinued  Essential hypertension- (present on admission) Continue Toprol-XL.  Obesity (BMI 30-39.9) BMI 32.21  Impaired fasting glucose Hemoglobin A1c 5.7.  Patient is not a diabetic.  Asthma- (present on admission) Continue nebulizer treatments.  Can go back on inhalers at facility.  Able to come off oxygen  SCHIZOPHRENIA, PARANOID, CHRONIC- (present on admission) Continue Depakote, Cogentin, and Invega        Subjective: Patient states that he has ankle pain.  Admitted after a fall and found to have a right ankle fracture.  Physical Exam: Vitals:   12/10/21 0442 12/10/21 0746 12/10/21 1133 12/10/21 1513  BP: (!) 143/97 131/84 (!) 144/86 123/89  Pulse: (!) 102 100 (!) 103 92  Resp: 16 20 16 16   Temp: 97.9 F (36.6 C) 98 F (36.7 C) 98.5 F (36.9 C) 98.1 F (36.7 C)  TempSrc:      SpO2: 94% 91% 95% 95%  Weight:      Height:       Physical Exam HENT:     Head: Normocephalic.     Mouth/Throat:     Pharynx: No oropharyngeal exudate.  Eyes:     General: Lids are normal.     Conjunctiva/sclera: Conjunctivae normal.  Cardiovascular:     Rate and Rhythm: Normal rate and regular rhythm.     Heart sounds: Normal heart sounds, S1 normal and S2 normal.  Pulmonary:     Breath sounds: Examination of the right-lower field reveals decreased breath sounds. Examination of the left-lower field reveals decreased breath sounds. Decreased breath sounds  present. No wheezing, rhonchi or rales.  Abdominal:     Palpations: Abdomen is soft.     Tenderness: There is no abdominal tenderness.  Musculoskeletal:     Right lower leg: No swelling.     Left lower leg: No swelling.  Skin:    General: Skin is warm.     Findings: No rash.  Neurological:     Mental Status: He is alert and oriented to person, place, and time.     Data Reviewed: Creatinine today down to 1.1   Family Communication: Updated patient's mother on the phone  Disposition: Status is: Inpatient Remains inpatient appropriate because: Awaiting insurance authorization to go to rehab  Planned Discharge Destination: Rehab  Author: Loletha Grayer, MD 12/10/2021 3:58 PM  For on call review [email or web address].

## 2021-12-10 NOTE — TOC Progression Note (Addendum)
Transition of Care [facility]) - Progression Note    Patient Details  Name: NEEMA FLUEGGE MRN: [id number] Date of Birth: 1966-08-27  Transition [facility] Care [facility]) CM/SW Contact  Marlowe Sax, RN Phone Number: 12/10/2021, 1:28 PM  Clinical Narrative:   Requested the physician to sign the Fl2 and the 30 day progress note to submit for PASSR,  Uploaded the H&P, Facesheet, Progress notes Fl2 to [place] [employer] to obtain the PASSr   Expected Discharge Plan: Skilled Nursing Facility Barriers to Discharge: Continued Medical Work up  Expected Discharge Plan and Services Expected Discharge Plan: Skilled Nursing Facility In-house Referral: Clinical Social Work     Living arrangements for the past 2 months: Group Home                                       Social Determinants of Health (SDOH) Interventions    Readmission Risk Interventions No flowsheet data found.

## 2021-12-10 NOTE — TOC Progression Note (Signed)
Transition of Care [facility]) - Progression Note    Patient Details  Name: [name] [name] MRN: [id number] Date of Birth: Jun 06, 1966  [facility]) CM/SW Contact  Marlowe Sax, RN Phone Number: 12/10/2021, 10:54 AM  Clinical Narrative:   Debbora Dus resent, no offers yet    Expected Discharge Plan: Skilled Nursing Facility Barriers to Discharge: Continued Medical Work up  Expected Discharge Plan and Services Expected Discharge Plan: Skilled Nursing Facility In-house Referral: Clinical Social Work     Living arrangements for the past 2 months: Group Home                                       Social Determinants of Health (SDOH) Interventions    Readmission Risk Interventions No flowsheet data found.

## 2021-12-10 NOTE — TOC Progression Note (Signed)
Transition of Care [facility]) - Progression Note    Patient Details  Name: [name] [name] MRN: [id number] Date of Birth: [birth year]  Transition of Care [facility]) CM/SW Contact  Marlowe Sax, RN Phone Number: 12/10/2021, 3:41 PM  Clinical Narrative:   received PASSR [id number] E expired 01/09/22, provided the inforamtion to Ricky at [employer], awaiting Ins approval    Expected Discharge Plan: Skilled Nursing Facility Barriers to Discharge: Continued Medical Work up  Expected Discharge Plan and Services Expected Discharge Plan: Skilled Nursing Facility In-house Referral: Clinical Social Work     Living arrangements for the past 2 months: Group Home                                       Social Determinants of Health (SDOH) Interventions    Readmission Risk Interventions No flowsheet data found.

## 2021-12-10 NOTE — Progress Notes (Signed)
°  Subjective:  POD #3 s/p ORIF of right malleolar ankle fracture.   Patient reports that ankle pain as mild to moderate.    Objective:   VITALS:   Vitals:   12/10/21 0227 12/10/21 0442 12/10/21 0746 12/10/21 1133  BP:  (!) 143/97 131/84 (!) 144/86  Pulse:  (!) 102 100 (!) 103  Resp:  16 20 16   Temp:  97.9 F (36.6 C) 98 F (36.7 C) 98.5 F (36.9 C)  TempSrc:      SpO2: 93% 94% 91% 95%  Weight:      Height:        PHYSICAL EXAM: Right lower extremity AO splint is in place and is clean and dry. Patient can flex and extend his toes. Patient has intact sensation to light touch in all 5 toes and the first dorsal webspace. All toes are well-perfused.  Compartments are soft and compressible.  LABS  Results for orders placed or performed during the hospital encounter of 12/06/21 (from the past 24 hour(s))  Basic metabolic panel     Status: Abnormal   Collection Time: 12/10/21  4:51 AM  Result Value Ref Range   Sodium 139 135 - 145 mmol/L   Potassium 3.9 3.5 - 5.1 mmol/L   Chloride 105 98 - 111 mmol/L   CO2 30 22 - 32 mmol/L   Glucose, Bld 101 (H) 70 - 99 mg/dL   BUN 19 6 - 20 mg/dL   Creatinine, Ser 02/07/22 0.61 - 1.24 mg/dL   Calcium 8.5 (L) 8.9 - 10.3 mg/dL   GFR, Estimated 0.92 >95 mL/min   Anion gap 4 (L) 5 - 15    No results found.  Assessment/Plan: 3 Days Post-Op   Principal Problem:   Closed trimalleolar fracture of right ankle Active Problems:   SCHIZOPHRENIA, PARANOID, CHRONIC   Essential hypertension   Asthma   Impaired fasting glucose   Ankle fracture   Dehydration  Patient is stable postop.  Continue with physical therapy.  Patient is nonweightbearing in the right lower extremity.  Patient will require skilled nursing facility upon discharge for continued postop rehab.  Continue Lovenox for DVT prophylaxis.    >74 , MD 12/10/2021, 2:03 PM

## 2021-12-10 NOTE — Assessment & Plan Note (Signed)
BMI 32.21

## 2021-12-10 NOTE — Progress Notes (Signed)
The patient listed is needing to go to short term Rehab center for strengthening and rehabilitation, He resides in a group home and is expected to return top the group home after a 30 days or less stay in Short term rehab,

## 2021-12-10 NOTE — Progress Notes (Signed)
Physical Therapy Treatment Patient Details Name: [name] [name] MRN: [id number] DOB: Feb 18, 1966 Today's Date: 12/10/2021   History of Present Illness Pt is a 56 y.o. male who presented to the emergency department at [facility] today after a fall in his front yard when he slipped on mud. Pt diagnosed with a R bimalleolar ankle fracture and is s/p ORIF. PMH includes HTN, paranoid schizophrenia, and depression.    PT Comments    Agrees to session.  To EOB with mod a x 1.  Pt initially tries on his own with encouragement but quickly gives up stating he cannot do it so assist is given.  He does do a bit better job today scooting his to EOB but remains unable to lateral scoot to allow for lateral scoot transfers to chair.  He remains unable to maintain NWB with standing so Hoyer type lift transfer is recommended at this time.  He remains sitting x 15 + minutes today.  While sitting he does actively participate in bathing and changing gown.  Breakfast arrives and he remains sitting to eat and brush teeth.  He is fatigued with activity and increased post lean noted with increased time but does tolerate sitting for a longer time today.  Returned to supine with min a x 1 for LE and max a x 2 to reposition in bed.     Recommendations for follow up therapy are one component of a multi-disciplinary discharge planning process, led by the attending physician.  Recommendations may be updated based on patient status, additional functional criteria and insurance authorization.  Follow Up Recommendations  Skilled nursing-short term rehab (<3 hours/day)     Assistance Recommended at Discharge Frequent or constant Supervision/Assistance  Patient can return home with the following Two people to help with walking and/or transfers;Two people to help with bathing/dressing/bathroom;Assistance with cooking/housework;Direct supervision/assist for medications management;Direct supervision/assist for financial management;Assist for  transportation   Equipment Recommendations  Other (comment) (TBD at next venue of care (pt currently has [facility] only))    Recommendations for Other Services       Precautions / Restrictions Precautions Precautions: Fall Restrictions Weight Bearing Restrictions: Yes RLE Weight Bearing: Non weight bearing     Mobility  Bed Mobility Overal bed mobility: Needs Assistance Bed Mobility: Supine to Sit, Sit to Supine     Supine to sit: Mod assist Sit to supine: Min assist        Transfers Overall transfer level: Needs assistance Equipment used: None               General transfer comment: Pt unablet to scoot or stand while adhering to WB orders    Ambulation/Gait               General Gait Details: unable/unsafe to attempt   Stairs             Wheelchair Mobility    Modified Rankin (Stroke Patients Only)       Balance Overall balance assessment: Needs assistance Sitting-balance support: Feet supported Sitting balance-Leahy Scale: Fair       Standing balance-Leahy Scale: Zero Standing balance comment: Unable to come to standing                            Cognition Arousal/Alertness: Awake/alert Behavior During Therapy: WFL for tasks assessed/performed Overall Cognitive Status: No family/caregiver present to determine baseline cognitive functioning  General Comments: Pt oriented to self, location, and situation; increased time and cuing needed to follow commands        Exercises      General Comments        Pertinent Vitals/Pain Pain Assessment Pain Assessment: Faces Faces Pain Scale: Hurts even more Pain Location: R ankle Pain Descriptors / Indicators: Aching, Guarding Pain Intervention(s): Monitored during session, Repositioned, Limited activity within patient's tolerance    Home Living                          Prior Function            PT Goals  (current goals can now be found in the care plan section) Progress towards PT goals: Progressing toward goals    Frequency    7X/week      PT Plan      Co-evaluation              AM-PAC PT "6 Clicks" Mobility   Outcome Measure  Help needed turning from your back to your side while in a flat bed without using bedrails?: A Little Help needed moving from lying on your back to sitting on the side of a flat bed without using bedrails?: A Little Help needed moving to and from a bed to a chair (including a wheelchair)?: Total Help needed standing up from a chair using your arms (e.g., wheelchair or bedside chair)?: Total Help needed to walk in hospital room?: Total Help needed climbing 3-5 steps with a railing? : Total 6 Click Score: 10    End of Session   Activity Tolerance: Patient tolerated treatment well Patient left: in bed;with nursing/sitter in room;with call bell/phone within reach;with bed alarm set (Pt left sitting EOB with CNA for bathing) Nurse Communication: Mobility status;Weight bearing status;Other (comment) (Nursing gave ok for trial on room air with results communicated to nursing and that pt left on room air at end of session) PT Visit Diagnosis: Unsteadiness on feet (R26.81);History of falling (Z91.81);Other abnormalities of gait and mobility (R26.89);Muscle weakness (generalized) (M62.81)     Time: 2297-9892 PT Time Calculation (min) (ACUTE ONLY): 24 min  Charges:  $Therapeutic Activity: 23-37 mins                    Danielle Dess, PTA 12/10/21, 10:23 AM

## 2021-12-11 LAB — RESP PANEL BY RT-PCR (FLU A&B, COVID) ARPGX2
Influenza A by PCR: NEGATIVE
Influenza B by PCR: NEGATIVE
SARS Coronavirus 2 by RT PCR: NEGATIVE

## 2021-12-11 MED ORDER — BISACODYL 10 MG RE SUPP
10.0000 mg | Freq: Once | RECTAL | Status: AC
  Administered 2021-12-11: 10 mg via RECTAL
  Filled 2021-12-11: qty 1

## 2021-12-11 MED ORDER — ASPIRIN EC 325 MG PO TBEC: 325.0000 mg | DELAYED_RELEASE_TABLET | Freq: Every day | ORAL | 0 refills | Status: DC

## 2021-12-11 MED ORDER — POLYETHYLENE GLYCOL 3350 17 G PO PACK
17.0000 g | PACK | Freq: Every day | ORAL | Status: DC
  Administered 2021-12-11 – 2021-12-12 (×2): 17 g via ORAL
  Filled 2021-12-11 (×2): qty 1

## 2021-12-11 MED ORDER — METOPROLOL SUCCINATE ER 100 MG PO TB24: 100.0000 mg | ORAL_TABLET | Freq: Every day | ORAL | 0 refills | Status: DC

## 2021-12-11 MED ORDER — DOCUSATE SODIUM 100 MG PO CAPS: 100.0000 mg | ORAL_CAPSULE | Freq: Two times a day (BID) | ORAL | 0 refills | Status: AC

## 2021-12-11 MED ORDER — METOPROLOL SUCCINATE ER 25 MG PO TB24
25.0000 mg | ORAL_TABLET | Freq: Once | ORAL | Status: AC
  Administered 2021-12-11: 25 mg via ORAL
  Filled 2021-12-11: qty 1

## 2021-12-11 MED ORDER — OXYCODONE HCL 5 MG PO TABS: 5.0000 mg | ORAL_TABLET | ORAL | 0 refills | Status: DC | PRN

## 2021-12-11 MED ORDER — BISACODYL 10 MG RE SUPP: 10.0000 mg | Freq: Once | RECTAL | 0 refills | Status: AC

## 2021-12-11 MED ORDER — METOPROLOL SUCCINATE ER 50 MG PO TB24
100.0000 mg | ORAL_TABLET | Freq: Every day | ORAL | Status: DC
  Administered 2021-12-12: 100 mg via ORAL
  Filled 2021-12-11: qty 2

## 2021-12-11 NOTE — TOC Progression Note (Signed)
Received a call for ins [name] [name] [id number], EMS approved [phone] To go to [facility]

## 2021-12-11 NOTE — Progress Notes (Signed)
Physical Therapy Treatment Patient Details Name: [name] [name] MRN: [id number] DOB: [birth year] Today's Date: 12/11/2021   History of Present Illness Pt is a 56 y.o. male who presented to the emergency department at [facility] today after a fall in his front yard when he slipped on mud. Pt diagnosed with a R bimalleolar ankle fracture and is s/p ORIF. PMH includes HTN, paranoid schizophrenia, and depression.    PT Comments    Pt struggled to fully engage with PT session, admits that he struggles to find motivation, appears cognition is also getting in the way of full participation.  He was able to get to near standing at EOB X 2 but with excessive assist, explanation and encouragement.  He did appear competent with keeping most of the weight off the R LE (heel on ground but not bearing weight in standing) but ultimately he remains very functionally limited.    Recommendations for follow up therapy are one component of a multi-disciplinary discharge planning process, led by the attending physician.  Recommendations may be updated based on patient status, additional functional criteria and insurance authorization.  Follow Up Recommendations  Skilled nursing-short term rehab (<3 hours/day)     Assistance Recommended at Discharge Frequent or constant Supervision/Assistance  Patient can return home with the following Two people to help with walking and/or transfers;Two people to help with bathing/dressing/bathroom;Assistance with cooking/housework;Direct supervision/assist for medications management;Direct supervision/assist for financial management;Assist for transportation   Equipment Recommendations   (TBD)    Recommendations for Other Services       Precautions / Restrictions Precautions Precautions: Fall Restrictions Weight Bearing Restrictions: Yes RLE Weight Bearing: Non weight bearing     Mobility  Bed Mobility Overal bed mobility: Needs Assistance Bed Mobility: Supine to Sit, Sit  to Supine     Supine to sit: Mod assist Sit to supine: Min assist   General bed mobility comments: Pt only able to self motivate a few moments at a time before arresting transition and needed plenty of cuing and direct assist to continue the transition to sitting    Transfers Overall transfer level: Needs assistance Equipment used: Rolling walker (2 wheels) Transfers: Sit to/from Stand Sit to Stand: Max assist, From elevated surface (raised bed ~3")           General transfer comment: Heavy encouragement and motivation prior to and during standing attempts.  He did manage to get to near standing (x2) but even with max assist could not shift hips forward enough to be able to maintain any semblence of upright balance    Ambulation/Gait               General Gait Details: unable/unsafe to attempt   Stairs             Wheelchair Mobility    Modified Rankin (Stroke Patients Only)       Balance Overall balance assessment: Needs assistance Sitting-balance support: Feet supported Sitting balance-Leahy Scale: Fair     Standing balance support: Bilateral upper extremity supported Standing balance-Leahy Scale: Zero                              Cognition Arousal/Alertness: Awake/alert Behavior During Therapy: WFL for tasks assessed/performed Overall Cognitive Status: No family/caregiver present to determine baseline cognitive functioning  Exercises      General Comments        Pertinent Vitals/Pain Pain Assessment Pain Assessment: Faces Faces Pain Scale: Hurts a little bit Pain Location: R ankle    Home Living                          Prior Function            PT Goals (current goals can now be found in the care plan section) Progress towards PT goals: Progressing toward goals    Frequency    7X/week      PT Plan Current plan remains appropriate     Co-evaluation              AM-PAC PT "6 Clicks" Mobility   Outcome Measure  Help needed turning from your back to your side while in a flat bed without using bedrails?: A Little Help needed moving from lying on your back to sitting on the side of a flat bed without using bedrails?: A Little Help needed moving to and from a bed to a chair (including a wheelchair)?: Total Help needed standing up from a chair using your arms (e.g., wheelchair or bedside chair)?: Total Help needed to walk in hospital room?: Total Help needed climbing 3-5 steps with a railing? : Total 6 Click Score: 10    End of Session Equipment Utilized During Treatment: Gait belt Activity Tolerance: Patient limited by fatigue Patient left: with bed alarm set;with call bell/phone within reach   PT Visit Diagnosis: Unsteadiness on feet (R26.81);History of falling (Z91.81);Other abnormalities of gait and mobility (R26.89);Muscle weakness (generalized) (M62.81)     Time: 7341-9379 PT Time Calculation (min) (ACUTE ONLY): 17 min  Charges:  $Therapeutic Activity: 8-22 mins                     Malachi Pro, DPT 12/11/2021, 6:11 PM

## 2021-12-11 NOTE — Progress Notes (Signed)
°  Subjective:  POD #4 s/p ORIF of right trimalleolar ankle fracture.   Patient reports right ankle pain as mild and improved since yesterday.  He is currently receiving a nebulizer treatment.  Objective:   VITALS:   Vitals:   12/11/21 0752 12/11/21 0805 12/11/21 1120 12/11/21 1404  BP: 135/87  (!) 141/89   Pulse: (!) 103 (!) 105 (!) 104 (!) 110  Resp: 15 18 18 18   Temp: 97.9 F (36.6 C)  98.9 F (37.2 C)   TempSrc:      SpO2: 92% 91% 93% 94%  Weight:      Height:        PHYSICAL EXAM: Right lower extremity AO splint remains in place.  The splint is clean and dry. Patient's toes are well-perfused.  He can flex and extend his toes and has intact sensation light touch in all 5 toes of the right foot.  LABS  No results found for this or any previous visit (from the past 24 hour(s)).  No results found.  Assessment/Plan: 4 Days Post-Op   Principal Problem:   Closed trimalleolar fracture of right ankle Active Problems:   SCHIZOPHRENIA, PARANOID, CHRONIC   Essential hypertension   Asthma   Impaired fasting glucose   Ankle fracture   Dehydration   Obesity (BMI 30-39.9)  Patient improving from an orthopedic standpoint.  Patient will require skilled nursing facility upon discharge.  Recommend the patient be discharged on Lovenox 40 mg daily until follow-up.  Patient still awaiting insurance approval for skilled nursing facility.    , MD 12/11/2021, 2:16 PM

## 2021-12-11 NOTE — Progress Notes (Signed)
Progress Note   Patient: [name] [name] BMW:[id number] DOB: 1966-05-18 DOA: 12/06/2021     3 DOS: the patient was seen and examined on 12/11/2021   Brief hospital course: 56 year old man who lives at a group home and has medical problems of paranoid schizophrenia, essential hypertension, asthma presented to the hospital after a slip in the mud and found to have a trimalleolar fracture of the right ankle.  Dr. Martha Clan took to the operating room on 12/07/2021 for open reduction internal fixation of right ankle fracture.  The patient is nonweightbearing on the right leg and needs quite a bit of help to move around.  Awaiting insurance authorization for rehab.  Assessment and Plan: * Closed trimalleolar fracture of right ankle Dr. Martha Clan took to the operating room on 12/08/2021 for ORIF.  Pain control.  Physical therapy recommending rehab.  Patient nonweightbearing right ankle.  On DVT prophylaxis with Lovenox.  Still awaiting insurance authorization for rehab  Dehydration Patient's creatinine went up to 1.34 now back to 1.10.  Fluids discontinued  Essential hypertension- (present on admission) Increase Toprol-XL to 100 mg daily.  With heart rate and blood pressure being a little high.  Obesity (BMI 30-39.9) BMI 32.21  Impaired fasting glucose Hemoglobin A1c 5.7.  Patient is not a diabetic.  Asthma- (present on admission) Continue nebulizer treatments.  Can go back on inhalers at facility.  Able to come off oxygen  SCHIZOPHRENIA, PARANOID, CHRONIC- (present on admission) Continue Depakote, Cogentin, and Invega      Subjective: Patient still has a little pain in his right leg.  Breathing better.  Feels okay.  Admitted after fall and found to have a right ankle fracture.  Physical Exam: Vitals:   12/11/21 0752 12/11/21 0805 12/11/21 1120 12/11/21 1404  BP: 135/87  (!) 141/89   Pulse: (!) 103 (!) 105 (!) 104 (!) 110  Resp: 15 18 18 18   Temp: 97.9 F (36.6 C)  98.9 F (37.2 C)    TempSrc:      SpO2: 92% 91% 93% 94%  Weight:      Height:        Physical Exam HENT:     Head: Normocephalic.     Mouth/Throat:     Pharynx: No oropharyngeal exudate.  Eyes:     General: Lids are normal.     Conjunctiva/sclera: Conjunctivae normal.  Cardiovascular:     Rate and Rhythm: Regular rhythm. Tachycardia present.     Heart sounds: Normal heart sounds, S1 normal and S2 normal.  Pulmonary:     Breath sounds: Examination of the right-lower field reveals decreased breath sounds. Examination of the left-lower field reveals decreased breath sounds. Decreased breath sounds present. No wheezing, rhonchi or rales.  Abdominal:     Palpations: Abdomen is soft.     Tenderness: There is no abdominal tenderness.  Musculoskeletal:     Right lower leg: No swelling.     Left lower leg: No swelling.  Skin:    General: Skin is warm.     Findings: No rash.  Neurological:     Mental Status: He is alert.     Comments: Answers questions appropriately.  Able to wiggle toes and feel light touch on his right foot.     Data Reviewed: COVID test negative  Family Communication: Spoke with patient's mother last night  Disposition: Status is: Inpatient Remains inpatient appropriate because: Awaiting rehab  Planned Discharge Destination: Rehab  Author: , MD 12/11/2021 4:14 PM  For on call  review [email or web address].

## 2021-12-11 NOTE — Hospital Course (Signed)
56 year old man who lives at a group home and has medical problems of paranoid schizophrenia, essential hypertension, asthma presented to the hospital after a slip in the mud and found to have a trimalleolar fracture of the right ankle.  Dr. Martha Clan took to the operating room on 12/07/2021 for open reduction internal fixation of right ankle fracture.  The patient is nonweightbearing on the right leg and needs quite a bit of help to move around.  Awaiting insurance authorization for rehab.

## 2021-12-11 NOTE — Progress Notes (Signed)
Occupational Therapy Treatment Patient Details Name: JOURNEE KOHEN MRN: [id number] DOB: [birth year] Today's Date: 12/11/2021   History of present illness Pt is a 56 y.o. male who presented to the emergency department at [facility] today after a fall in his front yard when he slipped on mud. Pt diagnosed with a R bimalleolar ankle fracture and is s/p ORIF. PMH includes HTN, paranoid schizophrenia, and depression.   OT comments  Upon entering the room, pt supine in bed with 6/10 pain in R ankle. Pt needing increased encouragement to participate in OT intervention. Mod A for supine >sit with pt needing increased time and effort as he did not want therapist to touch/ assist with R LE. Static sitting balance with supervision. OT raising bed and placing R LE on top of therapist foot in an effort to tell if pt maintaining weight bearing. 3 attempts made to stand from elevated surface with pt putting forth minimal effort each time. Pt stating, " I just can't do it until I can use it". OT attempting to provide education on why pt needs to attempt to stand with use of B UEs and L LE. Sit >supine with min A  and once supine bed placed into trendelenburg and pt having to reposition himself with B UEs and L LE with mod multimodal cuing for technique. All needs within reach and pt continues to need SNF at discharge to address functional transfers and mobility.    Recommendations for follow up therapy are one component of a multi-disciplinary discharge planning process, led by the attending physician.  Recommendations may be updated based on patient status, additional functional criteria and insurance authorization.    Follow Up Recommendations  Skilled nursing-short term rehab (<3 hours/day)    Assistance Recommended at Discharge Frequent or constant Supervision/Assistance  Patient can return home with the following  Assist for transportation;Assistance with cooking/housework;Direct supervision/assist for medications  management;Two people to help with walking and/or transfers;A lot of help with bathing/dressing/bathroom;Help with stairs or ramp for entrance   Equipment Recommendations  Other (comment) (defer to next venue of care)       Precautions / Restrictions Precautions Precautions: Fall Restrictions Weight Bearing Restrictions: Yes RLE Weight Bearing: Non weight bearing       Mobility Bed Mobility Overal bed mobility: Needs Assistance Bed Mobility: Supine to Sit, Sit to Supine     Supine to sit: Mod assist Sit to supine: Min assist   General bed mobility comments: increased effort and rest breaks needed    Transfers Overall transfer level: Needs assistance Equipment used: None, Rolling walker (2 wheels)               General transfer comment: attempted stand x 3 reps with pt giving very little effort     Balance Overall balance assessment: Needs assistance Sitting-balance support: Feet supported Sitting balance-Leahy Scale: Fair         Standing balance comment: Unable to come to standing                           ADL either performed or assessed with clinical judgement     Vision Patient Visual Report: No change from baseline            Cognition Arousal/Alertness: Awake/alert Behavior During Therapy: WFL for tasks assessed/performed Overall Cognitive Status: No family/caregiver present to determine baseline cognitive functioning  Pertinent Vitals/ Pain       Pain Assessment Pain Assessment: Faces Faces Pain Scale: Hurts even more Pain Location: R ankle Pain Descriptors / Indicators: Aching, Guarding Pain Intervention(s): Monitored during session, Repositioned, Limited activity within patient's tolerance         Frequency  Min 2X/week        Progress Toward Goals  OT Goals(current goals can now be found in the care plan section)  Progress towards OT goals:  Progressing toward goals  Acute Rehab OT Goals Patient Stated Goal: to get out of bed OT Goal Formulation: With patient Time For Goal Achievement: 12/23/21 Potential to Achieve Goals: Good  Plan Discharge plan remains appropriate;Frequency remains appropriate       AM-PAC OT "6 Clicks" Daily Activity     Outcome Measure   Help from another person eating meals?: None Help from another person taking care of personal grooming?: A Little Help from another person toileting, which includes using toliet, bedpan, or urinal?: Total Help from another person bathing (including washing, rinsing, drying)?: A Lot Help from another person to put on and taking off regular upper body clothing?: A Little Help from another person to put on and taking off regular lower body clothing?: Total 6 Click Score: 14    End of Session Equipment Utilized During Treatment: Rolling walker (2 wheels)  OT Visit Diagnosis: Other abnormalities of gait and mobility (R26.89);Muscle weakness (generalized) (M62.81);Pain Pain - Right/Left: Right Pain - part of body: Leg   Activity Tolerance Other (comment) (pt is self limiting)   Patient Left in bed;with call bell/phone within reach;with bed alarm set   Nurse Communication Patient requests pain meds        Time: 3267-1245 OT Time Calculation (min): 24 min  Charges: OT General Charges $OT Visit: 1 Visit OT Treatments $Therapeutic Activity: 23-37 mins  Jackquline Denmark, MS, OTR/L , CBIS ascom [phone]  12/11/21, 2:36 PM

## 2021-12-11 NOTE — TOC Progression Note (Signed)
Transition of Care [facility]) - Progression Note    Patient Details  Name: DAQUARIUS DUBEAU MRN: [id number] Date of Birth: 07-22-1966  Transition of Care [facility]) CM/SW Contact  Marlowe Sax, RN Phone Number: 12/11/2021, 2:19 PM  Clinical Narrative:   [facility] and left a VM for Tammy inquiring about the status of the Ins auth to go to [facility] in [place] and EMS transport Auth, left my contact information    Expected Discharge Plan: Skilled Nursing Facility Barriers to Discharge: Continued Medical Work up  Expected Discharge Plan and Services Expected Discharge Plan: Skilled Nursing Facility In-house Referral: Clinical Social Work     Living arrangements for the past 2 months: Group Home                                       Social Determinants of Health (SDOH) Interventions    Readmission Risk Interventions No flowsheet data found.

## 2021-12-12 DIAGNOSIS — Z4789 Encounter for other orthopedic aftercare: Secondary | ICD-10-CM | POA: Diagnosis not present

## 2021-12-12 DIAGNOSIS — R1319 Other dysphagia: Secondary | ICD-10-CM | POA: Diagnosis not present

## 2021-12-12 DIAGNOSIS — R4182 Altered mental status, unspecified: Secondary | ICD-10-CM | POA: Diagnosis not present

## 2021-12-12 DIAGNOSIS — R404 Transient alteration of awareness: Secondary | ICD-10-CM | POA: Diagnosis not present

## 2021-12-12 DIAGNOSIS — S82841A Displaced bimalleolar fracture of right lower leg, initial encounter for closed fracture: Secondary | ICD-10-CM | POA: Diagnosis not present

## 2021-12-12 DIAGNOSIS — R41841 Cognitive communication deficit: Secondary | ICD-10-CM | POA: Diagnosis not present

## 2021-12-12 DIAGNOSIS — E161 Other hypoglycemia: Secondary | ICD-10-CM | POA: Diagnosis not present

## 2021-12-12 DIAGNOSIS — R519 Headache, unspecified: Secondary | ICD-10-CM | POA: Diagnosis not present

## 2021-12-12 DIAGNOSIS — I10 Essential (primary) hypertension: Secondary | ICD-10-CM | POA: Diagnosis not present

## 2021-12-12 DIAGNOSIS — E569 Vitamin deficiency, unspecified: Secondary | ICD-10-CM | POA: Diagnosis not present

## 2021-12-12 DIAGNOSIS — S0990XA Unspecified injury of head, initial encounter: Secondary | ICD-10-CM | POA: Diagnosis not present

## 2021-12-12 DIAGNOSIS — F200 Paranoid schizophrenia: Secondary | ICD-10-CM | POA: Diagnosis not present

## 2021-12-12 DIAGNOSIS — S82851D Displaced trimalleolar fracture of right lower leg, subsequent encounter for closed fracture with routine healing: Secondary | ICD-10-CM | POA: Diagnosis not present

## 2021-12-12 DIAGNOSIS — Z7401 Bed confinement status: Secondary | ICD-10-CM | POA: Diagnosis not present

## 2021-12-12 DIAGNOSIS — R7309 Other abnormal glucose: Secondary | ICD-10-CM | POA: Diagnosis not present

## 2021-12-12 DIAGNOSIS — R278 Other lack of coordination: Secondary | ICD-10-CM | POA: Diagnosis not present

## 2021-12-12 DIAGNOSIS — S82851S Displaced trimalleolar fracture of right lower leg, sequela: Secondary | ICD-10-CM | POA: Diagnosis not present

## 2021-12-12 DIAGNOSIS — K5900 Constipation, unspecified: Secondary | ICD-10-CM | POA: Diagnosis not present

## 2021-12-12 DIAGNOSIS — W19XXXA Unspecified fall, initial encounter: Secondary | ICD-10-CM | POA: Diagnosis not present

## 2021-12-12 DIAGNOSIS — M6259 Muscle wasting and atrophy, not elsewhere classified, multiple sites: Secondary | ICD-10-CM | POA: Diagnosis not present

## 2021-12-12 DIAGNOSIS — E559 Vitamin D deficiency, unspecified: Secondary | ICD-10-CM | POA: Diagnosis not present

## 2021-12-12 DIAGNOSIS — S82851A Displaced trimalleolar fracture of right lower leg, initial encounter for closed fracture: Secondary | ICD-10-CM | POA: Diagnosis not present

## 2021-12-12 DIAGNOSIS — J45998 Other asthma: Secondary | ICD-10-CM | POA: Diagnosis not present

## 2021-12-12 DIAGNOSIS — R52 Pain, unspecified: Secondary | ICD-10-CM | POA: Diagnosis not present

## 2021-12-12 DIAGNOSIS — W010XXA Fall on same level from slipping, tripping and stumbling without subsequent striking against object, initial encounter: Secondary | ICD-10-CM | POA: Diagnosis not present

## 2021-12-12 DIAGNOSIS — M47812 Spondylosis without myelopathy or radiculopathy, cervical region: Secondary | ICD-10-CM | POA: Diagnosis not present

## 2021-12-12 DIAGNOSIS — Z20822 Contact with and (suspected) exposure to covid-19: Secondary | ICD-10-CM | POA: Diagnosis not present

## 2021-12-12 DIAGNOSIS — Z9889 Other specified postprocedural states: Secondary | ICD-10-CM | POA: Diagnosis not present

## 2021-12-12 DIAGNOSIS — M25571 Pain in right ankle and joints of right foot: Secondary | ICD-10-CM | POA: Diagnosis not present

## 2021-12-12 DIAGNOSIS — R2681 Unsteadiness on feet: Secondary | ICD-10-CM | POA: Diagnosis not present

## 2021-12-12 DIAGNOSIS — R531 Weakness: Secondary | ICD-10-CM | POA: Diagnosis not present

## 2021-12-12 DIAGNOSIS — G4489 Other headache syndrome: Secondary | ICD-10-CM | POA: Diagnosis not present

## 2021-12-12 DIAGNOSIS — S30813A Abrasion of scrotum and testes, initial encounter: Secondary | ICD-10-CM | POA: Diagnosis not present

## 2021-12-12 DIAGNOSIS — K219 Gastro-esophageal reflux disease without esophagitis: Secondary | ICD-10-CM | POA: Diagnosis not present

## 2021-12-12 DIAGNOSIS — T426X1A Poisoning by other antiepileptic and sedative-hypnotic drugs, accidental (unintentional), initial encounter: Secondary | ICD-10-CM | POA: Diagnosis not present

## 2021-12-12 DIAGNOSIS — F324 Major depressive disorder, single episode, in partial remission: Secondary | ICD-10-CM | POA: Diagnosis not present

## 2021-12-12 DIAGNOSIS — W19XXXD Unspecified fall, subsequent encounter: Secondary | ICD-10-CM | POA: Diagnosis not present

## 2021-12-12 DIAGNOSIS — R5381 Other malaise: Secondary | ICD-10-CM | POA: Diagnosis not present

## 2021-12-12 DIAGNOSIS — M6281 Muscle weakness (generalized): Secondary | ICD-10-CM | POA: Diagnosis not present

## 2021-12-12 DIAGNOSIS — W06XXXA Fall from bed, initial encounter: Secondary | ICD-10-CM | POA: Diagnosis not present

## 2021-12-12 MED ORDER — IPRATROPIUM-ALBUTEROL 0.5-2.5 (3) MG/3ML IN SOLN: 3.0000 mL | RESPIRATORY_TRACT | Status: DC | PRN

## 2021-12-12 NOTE — Progress Notes (Signed)
°  Subjective:  POD #5 s/p ORIF of right trimalleolar ankle fracture..   Patient reports right ankle pain as mild.  Patient has no other complaints.  Objective:   VITALS:   Vitals:   12/12/21 0519 12/12/21 0718 12/12/21 0751 12/12/21 1145  BP: 122/85 132/78  125/85  Pulse: 91 84 86 93  Resp: 16 20 18 18   Temp: (!) 97.5 F (36.4 C) 98.6 F (37 C)  99.1 F (37.3 C)  TempSrc:      SpO2: 96% 96% 95% 93%  Weight:      Height:        PHYSICAL EXAM: Right lower extremity: AO splint remains in place and intact.  His wound and dressings are clean and dry. Patient can flex and extend his toes.  His toes are well-perfused.  Patient has intact sensation in all 5 toes.  His compartments are soft and compressible.  He has no skin breakdown around his splint.   LABS  Results for orders placed or performed during the hospital encounter of 12/06/21 (from the past 24 hour(s))  Resp Panel by RT-PCR (Flu A&B, Covid) Nasopharyngeal Swab     Status: None   Collection Time: 12/11/21  2:32 PM   Specimen: Nasopharyngeal Swab; Nasopharyngeal(NP) swabs in vial transport medium  Result Value Ref Range   SARS Coronavirus 2 by RT PCR NEGATIVE NEGATIVE   Influenza A by PCR NEGATIVE NEGATIVE   Influenza B by PCR NEGATIVE NEGATIVE    No results found.  Assessment/Plan: 5 Days Post-Op   Principal Problem:   Closed trimalleolar fracture of right ankle Active Problems:   SCHIZOPHRENIA, PARANOID, CHRONIC   Essential hypertension   Asthma   Impaired fasting glucose   Ankle fracture   Dehydration   Obesity (BMI 30-39.9)   Patient has received insurance authorization to go to a skilled nursing facility.  Patient will continue with physical therapy.  He will remain nonweightbearing on his right lower extremity for a total of 6 to 8 weeks postop.  Patient will elevate his right lower extremity whenever possible.  Patient will follow-up in our office in 10 to 14 days for staple removal and cast  application.  Patient will take enteric-coated aspirin 325 mg twice daily for DVT prophylaxis for 6 weeks postop.   02/08/22 , MD 12/12/2021, 1:25 PM

## 2021-12-12 NOTE — Care Management Important Message (Signed)
Important Message  Patient Details  Name: [name] [name] MRN: [id number] Date of Birth: [birth year]   Medicare Important Message Given:  Yes     Dannette Barbara 12/12/2021, 12:43 PM

## 2021-12-12 NOTE — Progress Notes (Signed)
PROGRESS NOTE    [name] [name]  [id number] DOB: Mar 20, 1966 DOA: 12/06/2021 PCP: Renaye Rakers, MD    Assessment & Plan:   Principal Problem:   Closed trimalleolar fracture of right ankle Active Problems:   SCHIZOPHRENIA, PARANOID, CHRONIC   Essential hypertension   Asthma   Impaired fasting glucose   Ankle fracture   Dehydration   Obesity (BMI 30-39.9)  Closed trimalleolar fracture of right ankle: s/p ORIF by ortho surg on 12/08/21. Nonweightbearing right ankle. On DVT prophylaxis with Lovenox.    Dehydration: resolved    HTN: continue on metoprolol    Obesity: BMI 32.2. Would benefit from weight loss    Pre-DM: hemoglobin A1c 5.7.  Will need repeat HbA1c in 3 months    Asthma: unknown stage and/or severity. Continue on bronchodilators   Paranoid schizophrenia: continue on home anti-psychotics     DVT prophylaxis: lovenox  Code Status: full  Family Communication:  Disposition Plan: d/c to SNF  Level of care: Med-Surg Consultants:  Hospitalist   Procedures:   Antimicrobials:    Subjective: Pt c/o fatigue   Objective: Vitals:   12/11/21 2217 12/12/21 0519 12/12/21 0718 12/12/21 0751  BP: 132/90 122/85 132/78   Pulse: (!) 106 91 84 86  Resp: 16 16 20 18   Temp:  (!) 97.5 F (36.4 C) 98.6 F (37 C)   TempSrc:      SpO2:  96% 96% 95%  Weight:      Height:        Intake/Output Summary (Last 24 hours) at 12/12/2021 0856 Last data filed at 12/11/2021 2100 Gross per 24 hour  Intake --  Output 650 ml  Net -650 ml   Filed Weights   12/06/21 1514 12/06/21 2012  Weight: 113.8 kg 113.8 kg    Examination:  General exam: Appears calm and comfortable  Respiratory system: Clear to auscultation. Respiratory effort normal. Cardiovascular system: S1 & S2 +. No rubs, gallops or clicks Gastrointestinal system: Abdomen is nondistended, soft and nontender. Normal bowel sounds heard. Central nervous system: Alert and awake. Moves all extremities  Psychiatry:  Judgement and insight appear poor. Mood & affect appropriate.     Data Reviewed: I have personally reviewed following labs and imaging studies  CBC: Recent Labs  Lab 12/06/21 1655 12/07/21 0349 12/08/21 0454  WBC 13.3* 11.4* 12.6*  NEUTROABS 11.9*  --   --   HGB 15.4 14.7 14.3  HCT 46.1 43.0 42.9  MCV 96.8 95.1 96.2  PLT 114* 183 170   Basic Metabolic Panel: Recent Labs  Lab 12/06/21 1655 12/06/21 1849 12/08/21 0454 12/09/21 0441 12/10/21 0451  NA 138 138 137 141 139  K 4.1 4.2 4.9 4.0 3.9  CL 104 104 102 107 105  CO2 27 27 27 29 30   GLUCOSE 138* 125* 117* 142* 101*  BUN 11 11 17  21* 19  CREATININE 1.10 1.09 1.34* 1.34* 1.10  CALCIUM 8.4* 8.6* 8.5* 8.3* 8.5*   GFR: Estimated Creatinine Clearance: 104.1 mL/min (by C-G formula based on SCr of 1.1 mg/dL). Liver Function Tests: Recent Labs  Lab 12/06/21 1849  AST 36  ALT 24  ALKPHOS 48  BILITOT 0.5  PROT 6.5  ALBUMIN 3.2*   No results for input(s): LIPASE, AMYLASE in the last 168 hours. No results for input(s): AMMONIA in the last 168 hours. Coagulation Profile: Recent Labs  Lab 12/06/21 1849  INR 1.0   Cardiac Enzymes: No results for input(s): CKTOTAL, CKMB, CKMBINDEX, TROPONINI in the last 168 hours.  BNP (last 3 results) No results for input(s): PROBNP in the last 8760 hours. HbA1C: No results for input(s): HGBA1C in the last 72 hours. CBG: No results for input(s): GLUCAP in the last 168 hours. Lipid Profile: No results for input(s): CHOL, HDL, LDLCALC, TRIG, CHOLHDL, LDLDIRECT in the last 72 hours. Thyroid Function Tests: No results for input(s): TSH, T4TOTAL, FREET4, T3FREE, THYROIDAB in the last 72 hours. Anemia Panel: No results for input(s): VITAMINB12, FOLATE, FERRITIN, TIBC, IRON, RETICCTPCT in the last 72 hours. Sepsis Labs: No results for input(s): PROCALCITON, LATICACIDVEN in the last 168 hours.  Recent Results (from the past 240 hour(s))  Resp Panel by RT-PCR (Flu A&B, Covid)  Nasopharyngeal Swab     Status: None   Collection Time: 12/06/21  4:56 PM   Specimen: Nasopharyngeal Swab; Nasopharyngeal(NP) swabs in vial transport medium  Result Value Ref Range Status   SARS Coronavirus 2 by RT PCR NEGATIVE NEGATIVE Final    Comment: (NOTE) SARS-CoV-2 target nucleic acids are NOT DETECTED.  The SARS-CoV-2 RNA is generally detectable in upper respiratory specimens during the acute phase of infection. The lowest concentration of SARS-CoV-2 viral copies this assay can detect is 138 copies/mL. A negative result does not preclude SARS-Cov-2 infection and should not be used as the sole basis for treatment or other patient management decisions. A negative result may occur with  improper specimen collection/handling, submission of specimen other than nasopharyngeal swab, presence of viral mutation(s) within the areas targeted by this assay, and inadequate number of viral copies(<138 copies/mL). A negative result must be combined with clinical observations, patient history, and epidemiological information. The expected result is Negative.  Fact Sheet for Patients:  BloggerCourse.comhttps://www.fda.gov/media/152166/download  Fact Sheet for Healthcare Providers:  SeriousBroker.ithttps://www.fda.gov/media/152162/download  This test is no t yet approved or cleared by the [place] FDA and  has been authorized for detection and/or diagnosis of SARS-CoV-2 by FDA under an Emergency Use Authorization (EUA). This EUA will remain  in effect (meaning this test can be used) for the duration of the COVID-19 declaration under Section 564(b)(1) of the Act, 21 U.S.C.section 360bbb-3(b)(1), unless the authorization is terminated  or revoked sooner.       Influenza A by PCR NEGATIVE NEGATIVE Final   Influenza B by PCR NEGATIVE NEGATIVE Final    Comment: (NOTE) The Xpert Xpress SARS-CoV-2/FLU/RSV plus assay is intended as an aid in the diagnosis of influenza from Nasopharyngeal swab specimens and should not be  used as a sole basis for treatment. Nasal washings and aspirates are unacceptable for Xpert Xpress SARS-CoV-2/FLU/RSV testing.  Fact Sheet for Patients: BloggerCourse.comhttps://www.fda.gov/media/152166/download  Fact Sheet for Healthcare Providers: SeriousBroker.ithttps://www.fda.gov/media/152162/download  This test is not yet approved or cleared by the [place] FDA and has been authorized for detection and/or diagnosis of SARS-CoV-2 by FDA under an Emergency Use Authorization (EUA). This EUA will remain in effect (meaning this test can be used) for the duration of the COVID-19 declaration under Section 564(b)(1) of the Act, 21 U.S.C. section 360bbb-3(b)(1), unless the authorization is terminated or revoked.  Performed at [facility], [address] [place], [place] [address]   Surgical PCR screen     Status: Abnormal   Collection Time: 12/06/21  8:30 PM   Specimen: Nasal Mucosa; Nasal Swab  Result Value Ref Range Status   MRSA, PCR NEGATIVE NEGATIVE Final   Staphylococcus aureus POSITIVE (A) NEGATIVE Final    Comment: (NOTE) The Xpert SA Assay (FDA approved for NASAL specimens in patients 54 years of age  and older), is one component of a comprehensive surveillance program. It is not intended to diagnose infection nor to guide or monitor treatment. Performed at [facility], [address] [place], [place] [address]   Resp Panel by RT-PCR (Flu A&B, Covid) Nasopharyngeal Swab     Status: None   Collection Time: 12/11/21  2:32 PM   Specimen: Nasopharyngeal Swab; Nasopharyngeal(NP) swabs in vial transport medium  Result Value Ref Range Status   SARS Coronavirus 2 by RT PCR NEGATIVE NEGATIVE Final    Comment: (NOTE) SARS-CoV-2 target nucleic acids are NOT DETECTED.  The SARS-CoV-2 RNA is generally detectable in upper respiratory specimens during the acute phase of infection. The lowest concentration of SARS-CoV-2 viral copies this assay can detect is 138 copies/mL. A negative  result does not preclude SARS-Cov-2 infection and should not be used as the sole basis for treatment or other patient management decisions. A negative result may occur with  improper specimen collection/handling, submission of specimen other than nasopharyngeal swab, presence of viral mutation(s) within the areas targeted by this assay, and inadequate number of viral copies(<138 copies/mL). A negative result must be combined with clinical observations, patient history, and epidemiological information. The expected result is Negative.  Fact Sheet for Patients:  BloggerCourse.com  Fact Sheet for Healthcare Providers:  SeriousBroker.it  This test is no t yet approved or cleared by the [place] FDA and  has been authorized for detection and/or diagnosis of SARS-CoV-2 by FDA under an Emergency Use Authorization (EUA). This EUA will remain  in effect (meaning this test can be used) for the duration of the COVID-19 declaration under Section 564(b)(1) of the Act, 21 U.S.C.section 360bbb-3(b)(1), unless the authorization is terminated  or revoked sooner.       Influenza A by PCR NEGATIVE NEGATIVE Final   Influenza B by PCR NEGATIVE NEGATIVE Final    Comment: (NOTE) The Xpert Xpress SARS-CoV-2/FLU/RSV plus assay is intended as an aid in the diagnosis of influenza from Nasopharyngeal swab specimens and should not be used as a sole basis for treatment. Nasal washings and aspirates are unacceptable for Xpert Xpress SARS-CoV-2/FLU/RSV testing.  Fact Sheet for Patients: BloggerCourse.com  Fact Sheet for Healthcare Providers: SeriousBroker.it  This test is not yet approved or cleared by the [place] FDA and has been authorized for detection and/or diagnosis of SARS-CoV-2 by FDA under an Emergency Use Authorization (EUA). This EUA will remain in effect (meaning this test can be used)  for the duration of the COVID-19 declaration under Section 564(b)(1) of the Act, 21 U.S.C. section 360bbb-3(b)(1), unless the authorization is terminated or revoked.  Performed at [facility], [address], [place], [place] [address]          Radiology Studies: No results found.      Scheduled Meds:  benztropine  1 mg Oral TID   divalproex  2,000 mg Oral QHS   docusate sodium  100 mg Oral BID   enoxaparin (LOVENOX) injection  40 mg Subcutaneous Daily   fluticasone furoate-vilanterol  1 puff Inhalation Daily   ipratropium-albuterol  3 mL Nebulization TID   metoprolol succinate  100 mg Oral Daily   paliperidone  6 mg Oral QHS   pantoprazole  40 mg Oral Daily   polyethylene glycol  17 g Oral Daily   Continuous Infusions:  methocarbamol (ROBAXIN) IV     vancomycin       LOS: 4 days    Time spent: 15 mins    Emogene Morgan  Artelia Laroche, MD [facility] Pager [phone]  If 7PM-7AM, please contact night-coverage 12/12/2021, 8:56 AM

## 2021-12-12 NOTE — Progress Notes (Signed)
°   12/11/21 2059  Assess: MEWS Score  Temp 99.3 F (37.4 C)  BP 135/87  Pulse Rate (!) 124  Resp 18  SpO2 94 %  O2 Device Room Air  Assess: MEWS Score  MEWS Temp 0  MEWS Systolic 0  MEWS Pulse 2  MEWS RR 0  MEWS LOC 0  MEWS Score 2  MEWS Score Color Yellow  Assess: if the MEWS score is Yellow or Red  Were vital signs taken at a resting state? Yes  Focused Assessment No change from prior assessment  Does the patient meet 2 or more of the SIRS criteria? No  MEWS guidelines implemented *See Row Information* Yes  Treat  MEWS Interventions Other (Comment)  Pain Scale 0-10  Pain Score 3  Pain Type Surgical pain  Pain Location Ankle  Pain Orientation Right  Pain Descriptors / Indicators Aching  Pain Frequency Intermittent  Pain Intervention(s) Repositioned  Multiple Pain Sites No  Take Vital Signs  Increase Vital Sign Frequency  Yellow: Q 2hr X 2 then Q 4hr X 2, if remains yellow, continue Q 4hrs  Escalate  MEWS: Escalate Yellow: discuss with charge nurse/RN and consider discussing with provider and RRT  Notify: Charge Nurse/RN  Name of Charge Nurse/RN Notified Olivia  Date Charge Nurse/RN Notified 12/11/21  Time Charge Nurse/RN Notified 2059  Assess: SIRS CRITERIA  SIRS Temperature  0  SIRS Pulse 1  SIRS Respirations  0  SIRS WBC 0  SIRS Score Sum  1

## 2021-12-12 NOTE — Discharge Summary (Signed)
Physician Discharge Summary  Patient ID: [name] [name] MRN: [id number] DOB/AGE: Mar 08, 1966 56 y.o.  Admit date: 12/06/2021 Discharge date: 12/12/2021  Admission Diagnoses:  Right ankle fracture Closed trimalleolar fracture of right ankle  Discharge Diagnoses:  Right ankle fracture Principal Problem:   Closed trimalleolar fracture of right ankle Active Problems:   SCHIZOPHRENIA, PARANOID, CHRONIC   Essential hypertension   Asthma   Impaired fasting glucose   Ankle fracture   Dehydration   Obesity (BMI 30-39.9)   Past Medical History:  Diagnosis Date   Acute bronchitis    Asthma    Depression    Hypertension    Schizophrenia ([place])     Surgeries: Procedure(s): OPEN REDUCTION INTERNAL FIXATION (ORIF) ANKLE FRACTURE on 12/07/2021   Consultants (if any): Treatment Team:  Loletha Grayer, MD Thornton Park, MD  Discharged Condition: Improved  Hospital Course: [name] [name] is an 56 y.o. male who was admitted 12/06/2021 with a diagnosis of  Right ankle fracture Closed trimalleolar fracture of right ankle and went to the operating room on 12/07/2021 and underwent an uncomplicated ORIF of a right trimalleolar ankle fracture.    He was given perioperative antibiotics:  Anti-infectives (From admission, onward)    Start     Dose/Rate Route Frequency Ordered Stop   12/08/21 1115  vancomycin (VANCOCIN) IVPB 1000 mg/200 mL premix        1,000 mg 200 mL/hr over 60 Minutes Intravenous  Once 12/08/21 1022 12/08/21 1434   12/07/21 1300  vancomycin (VANCOCIN) IVPB 1000 mg/200 mL premix        1,000 mg 200 mL/hr over 60 Minutes Intravenous 60 min pre-op 12/07/21 1300     12/07/21 0600  vancomycin (VANCOCIN) IVPB 1000 mg/200 mL premix  Status:  Discontinued        1,000 mg 200 mL/hr over 60 Minutes Intravenous  Once 12/06/21 1827 12/07/21 1456     .  He was given sequential compression devices, early ambulation, and lovenox for DVT prophylaxis.  Patient was admitted  postoperatively for pain control.  He had a physical therapy evaluation and was recommended for skilled nursing facility.  Patient remained stable throughout his hospitalization.  He received breathing treatments for his asthma while an inpatient.  Patient made expected progress with physical therapy and given his clinical improvement was prepared for discharge to a skilled nursing facility once insurance approval was obtained.  He benefited maximally from the hospital stay and there were no complications.    Recent vital signs:  Vitals:   12/12/21 0751 12/12/21 1145  BP:  125/85  Pulse: 86 93  Resp: 18 18  Temp:  99.1 F (37.3 C)  SpO2: 95% 93%    Recent laboratory studies:  Lab Results  Component Value Date   HGB 14.3 12/08/2021   HGB 14.7 12/07/2021   HGB 15.4 12/06/2021   Lab Results  Component Value Date   WBC 12.6 (H) 12/08/2021   PLT 170 12/08/2021   Lab Results  Component Value Date   INR 1.0 12/06/2021   Lab Results  Component Value Date   NA 139 12/10/2021   K 3.9 12/10/2021   CL 105 12/10/2021   CO2 30 12/10/2021   BUN 19 12/10/2021   CREATININE 1.10 12/10/2021   GLUCOSE 101 (H) 12/10/2021    Discharge Medications:   Allergies as of 12/12/2021       Reactions   Cefprozil    Cetirizine Hcl    Doxycycline  Medication List     TAKE these medications    albuterol 108 (90 Base) MCG/ACT inhaler Commonly known as: VENTOLIN HFA Inhale 2 puffs into the lungs every 4 (four) hours as needed for cough or congestion.   aspirin EC 325 MG tablet Take 1 tablet (325 mg total) by mouth daily.   benztropine 1 MG tablet Commonly known as: COGENTIN Take 1 mg by mouth 3 (three) times daily.   Breo Ellipta 200-25 MCG/ACT Aepb Generic drug: fluticasone furoate-vilanterol Inhale 1 puff into the lungs daily.   cholecalciferol 25 MCG (1000 UNIT) tablet Commonly known as: VITAMIN D Take 2,000 Units by mouth daily.   divalproex 500 MG 24 hr  tablet Commonly known as: DEPAKOTE ER Take 2,000 mg by mouth at bedtime.   docusate sodium 100 MG capsule Commonly known as: COLACE Take 1 capsule (100 mg total) by mouth 2 (two) times daily.   metoprolol succinate 100 MG 24 hr tablet Commonly known as: TOPROL-XL Take 1 tablet (100 mg total) by mouth daily. Take with or immediately following a meal. What changed:  medication strength how much to take additional instructions   multivitamin with minerals tablet Take 1 tablet by mouth daily.   omeprazole 20 MG capsule Commonly known as: PRILOSEC Take 20 mg by mouth daily.   oxyCODONE 5 MG immediate release tablet Commonly known as: Oxy IR/ROXICODONE Take 1-2 tablets (5-10 mg total) by mouth every 4 (four) hours as needed for breakthrough pain ((for MODERATE breakthrough pain)).   paliperidone 6 MG 24 hr tablet Commonly known as: INVEGA Take 6 mg by mouth at bedtime.       ASK your doctor about these medications    bisacodyl 10 MG suppository Commonly known as: DULCOLAX Place 1 suppository (10 mg total) rectally once for 1 dose. Ask about: Should I take this medication?        Diagnostic Studies: DG Knee 2 Views Right  Result Date: 12/06/2021 CLINICAL DATA:  Slipped and fell, twisting injury, pain and swelling EXAM: RIGHT FOOT - 2 VIEW; RIGHT KNEE - 1-2 VIEW; RIGHT ANKLE - 2 VIEW COMPARISON:  None. FINDINGS: Right knee: Frontal and lateral views demonstrate no fractures. Mild medial compartmental joint space narrowing. Trace joint effusion. Soft tissues are unremarkable. Right ankle: Frontal and cross-table lateral views of the right ankle are obtained. There is an oblique lateral malleolar fracture as well as a transverse medial malleolar fracture. Mild dorsal and lateral translation of the talus relative to the tibial plafond. I do not see any definite posterior malleolar fracture. Mild tibiotalar osteoarthritis. Diffuse soft tissue swelling. Right foot: Frontal and  lateral views are obtained. No acute fracture. Joint spaces are well preserved. Mild soft tissue swelling of the hindfoot. IMPRESSION: 1. Bimalleolar right ankle fracture, with slight dorsal and lateral translation of the talus relative to the tibial plafond. No evidence of dislocation. 2. Diffuse soft tissue swelling of the right ankle and hindfoot. 3. Mild medial compartmental right knee osteoarthritis and trace joint effusion. Electronically Signed   By: Randa Ngo M.D.   On: 12/06/2021 16:38   DG Ankle 2 Views Right  Result Date: 12/06/2021 CLINICAL DATA:  Slipped and fell, twisting injury, pain and swelling EXAM: RIGHT FOOT - 2 VIEW; RIGHT KNEE - 1-2 VIEW; RIGHT ANKLE - 2 VIEW COMPARISON:  None. FINDINGS: Right knee: Frontal and lateral views demonstrate no fractures. Mild medial compartmental joint space narrowing. Trace joint effusion. Soft tissues are unremarkable. Right ankle: Frontal and cross-table lateral views  of the right ankle are obtained. There is an oblique lateral malleolar fracture as well as a transverse medial malleolar fracture. Mild dorsal and lateral translation of the talus relative to the tibial plafond. I do not see any definite posterior malleolar fracture. Mild tibiotalar osteoarthritis. Diffuse soft tissue swelling. Right foot: Frontal and lateral views are obtained. No acute fracture. Joint spaces are well preserved. Mild soft tissue swelling of the hindfoot. IMPRESSION: 1. Bimalleolar right ankle fracture, with slight dorsal and lateral translation of the talus relative to the tibial plafond. No evidence of dislocation. 2. Diffuse soft tissue swelling of the right ankle and hindfoot. 3. Mild medial compartmental right knee osteoarthritis and trace joint effusion. Electronically Signed   By: Randa Ngo M.D.   On: 12/06/2021 16:38   CT ANKLE RIGHT WO CONTRAST  Result Date: 12/06/2021 CLINICAL DATA:  Ankle trauma, fracture EXAM: CT OF THE RIGHT ANKLE WITHOUT CONTRAST  TECHNIQUE: Multidetector CT imaging of the right ankle was performed according to the standard protocol. Multiplanar CT image reconstructions were also generated. RADIATION DOSE REDUCTION: This exam was performed according to the departmental dose-optimization program which includes automated exposure control, adjustment of the mA and/or kV according to patient size and/or use of iterative reconstruction technique. COMPARISON:  Ankle and foot radiograph performed earlier on the same date FINDINGS: Bones/Joint/Cartilage Oblique fracture through the distal fibula/lateral malleolus with approximately 1 shaft width posterior displacement and 1.1 cm overlap between the fracture fragment. There is also small avulsion fracture about the tip of the lateral malleolus. Displaced fracture of the posterior malleolus with approximately 6 mm posterior displacement of the major osseous fragment which measures approximately 2.2 x 0.7 cm. There is also displaced fracture of the medial malleolus with approximately 5 mm inferolateral displacement of the distal fracture fragment. There is also depressed fracture of the medial aspect of the talar dome with approximately 3 mm depression and multiple small intra-articular osseous fragments, the 2 large osseous fragments measuring 4 and 3 mm. There is tibiotalar translation with approximately 1.1 cm lateral and approximately 1 cm posterior dislocation of the talar dome relative to the tibial plafond. Ligaments Suboptimally assessed by CT. Muscles and Tendons Achilles tendon is intact. Extensor tendons are intact. Evaluation of flexor and peroneal tendons is somewhat limited due to marked edema. No intramuscular hematoma. Soft tissues Marked soft tissue swelling about the ankle. IMPRESSION: 1. Trimalleolar fracture with posterolateral translation at the tibiotalar joint as detailed above. 2. Depressed fracture of the medial aspect of the talar dome with at least two 3 and 4 mm  intra-articular osseous fragments. 3. No evidence of tendon entrapment, there is however significant edema about the peroneal tendons, peroneal tendon injury can not be excluded. Electronically Signed   By: Keane Police D.O.   On: 12/06/2021 18:33   CT 3D RECON AT SCANNER  Result Date: 12/06/2021 CLINICAL DATA:  Nonspecific (abnormal) findings on radiological and other examination of musculoskeletal system right ankle. EXAM: 3-DIMENSIONAL CT IMAGE RENDERING ON ACQUISITION WORKSTATION TECHNIQUE: 3-dimensional CT images were rendered by post-processing of the original CT data on an acquisition workstation. The 3-dimensional CT images were interpreted and findings were reported in the accompanying complete CT report for this study COMPARISON:  X-ray right ankle 12/06/2021, CT right ankle 12/06/2021 FINDINGS: Redemonstration of an acute displaced trimalleolar fracture. Cortical irregularity along the talar dome consistent with known fracture. IMPRESSION: Acute trimalleolar and talar dome fracture. Electronically Signed   By: Iven Finn M.D.   On: 12/06/2021 18:38  DG Ankle Right Port  Result Date: 12/07/2021 CLINICAL DATA:  Postop right ankle. EXAM: PORTABLE RIGHT ANKLE - 2 VIEW COMPARISON:  Preoperative imaging yesterday. FINDINGS: Plate and multi screw fixation with interfragmentary screws traversing distal fibular fracture. Two screws traverse the medial malleolar fracture. Fractures are in improved alignment from preoperative imaging. The ankle mortise is now congruent. Talar dome irregularity is again seen. Overlying cast material in place. Lateral and medial skin staples in place. IMPRESSION: ORIF of distal fibular and tibial fractures, in improved alignment from preoperative imaging. Electronically Signed   By: Keith Rake M.D.   On: 12/07/2021 18:13   DG Ankle Right Port  Result Date: 12/06/2021 CLINICAL DATA:  Status post reduction and casting EXAM: PORTABLE RIGHT ANKLE - 2 VIEW COMPARISON:   Films from earlier in the same day. FINDINGS: Trimalleolar fracture is again identified with slight decrease in the degree of fracture fragment displacement. The fracture involving the medial aspect of the talar dome is again noted. The talus remains somewhat posteriorly and laterally displaced. No new fracture seen. Splinting material is noted. IMPRESSION: Trimalleolar fracture with slight reduction following splinting. There remains some posterolateral displacement of the talus with respect to the distal tibia. Electronically Signed   By: Inez Catalina M.D.   On: 12/06/2021 19:27   DG Foot 2 Views Right  Result Date: 12/06/2021 CLINICAL DATA:  Slipped and fell, twisting injury, pain and swelling EXAM: RIGHT FOOT - 2 VIEW; RIGHT KNEE - 1-2 VIEW; RIGHT ANKLE - 2 VIEW COMPARISON:  None. FINDINGS: Right knee: Frontal and lateral views demonstrate no fractures. Mild medial compartmental joint space narrowing. Trace joint effusion. Soft tissues are unremarkable. Right ankle: Frontal and cross-table lateral views of the right ankle are obtained. There is an oblique lateral malleolar fracture as well as a transverse medial malleolar fracture. Mild dorsal and lateral translation of the talus relative to the tibial plafond. I do not see any definite posterior malleolar fracture. Mild tibiotalar osteoarthritis. Diffuse soft tissue swelling. Right foot: Frontal and lateral views are obtained. No acute fracture. Joint spaces are well preserved. Mild soft tissue swelling of the hindfoot. IMPRESSION: 1. Bimalleolar right ankle fracture, with slight dorsal and lateral translation of the talus relative to the tibial plafond. No evidence of dislocation. 2. Diffuse soft tissue swelling of the right ankle and hindfoot. 3. Mild medial compartmental right knee osteoarthritis and trace joint effusion. Electronically Signed   By: Randa Ngo M.D.   On: 12/06/2021 16:38   DG MINI C-ARM IMAGE ONLY  Result Date: 12/07/2021 There is  no interpretation for this exam.  This order is for images obtained during a surgical procedure.  Please See "Surgeries" Tab for more information regarding the procedure.    Disposition: Discharge disposition: 03-Skilled Nursing Facility       Discharge Instructions     Call MD / Call 911   Complete by: As directed    If you experience chest pain or shortness of breath, CALL 911 and be transported to the hospital emergency room.  If you develope a fever above 101 F, pus (white drainage) or increased drainage or redness at the wound, or calf pain, call your surgeon's office.   Constipation Prevention   Complete by: As directed    Drink plenty of fluids.  Prune juice may be helpful.  You may use a stool softener, such as Colace (over the counter) 100 mg twice a day.  Use MiraLax (over the counter) for constipation as needed.  Diet general   Complete by: As directed    Discharge instructions   Complete by: As directed    Patient is NON-WEIGHTBEARING on the right ankle for 6-8 weeks post-op.  Continue continuous elevation of the right leg unless he is working with PT.  Patient may apply ice packs to the right ankle.  Patient should continue PT for gait training on crutches or a walker and lower leg strengthening.  He will take aspirin 325 mg PO twice a day for DVT prophylaxis for a total of 6 weeks post-op.  Patient needs to cover the splint in a plastic bag for showers.   Driving restrictions   Complete by: As directed    No driving for [id number] weeks   Increase activity slowly as tolerated   Complete by: As directed    Lifting restrictions   Complete by: As directed    No lifting for 12-16 weeks   Post-operative opioid taper instructions:   Complete by: As directed    POST-OPERATIVE OPIOID TAPER INSTRUCTIONS: It is important to wean off of your opioid medication as soon as possible. If you do not need pain medication after your surgery it is ok to stop day one. Opioids include: Codeine,  Hydrocodone(Norco, Vicodin), Oxycodone(Percocet, oxycontin) and hydromorphone amongst others.  Long term and even short term use of opiods can cause: Increased pain response Dependence Constipation Depression Respiratory depression And more.  Withdrawal symptoms can include Flu like symptoms Nausea, vomiting And more Techniques to manage these symptoms Hydrate well Eat regular healthy meals Stay active Use relaxation techniques(deep breathing, meditating, yoga) Do Not substitute Alcohol to help with tapering If you have been on opioids for less than two weeks and do not have pain than it is ok to stop all together.  Plan to wean off of opioids This plan should start within one week post op of your joint replacement. Maintain the same interval or time between taking each dose and first decrease the dose.  Cut the total daily intake of opioids by one tablet each day Next start to increase the time between doses. The last dose that should be eliminated is the evening dose.             Signed: Thornton Park ,MD 12/12/2021, 1:30 PM

## 2021-12-12 NOTE — Progress Notes (Signed)
Report called to Gregary Signs, LPN at [facility]. EMS arrived for transport.

## 2021-12-12 NOTE — TOC Progression Note (Signed)
Transition of Care [facility]) - Progression Note    Patient Details  Name: [name] [name] MRN: [id number] Date of Birth: Apr 30, 1966  Transition of Care [facility]) CM/SW Contact  Marlowe Sax, RN Phone Number: 12/12/2021, 2:02 PM  Clinical Narrative:   Called EMS to transport the patient to room E3 at [facility], The patient's mother is aware and stated that she is unable to walk so therefor will not be going to [facility], Tarkio at [employer] is aware and stated that he too spoke with the patient's mother    Expected Discharge Plan: Skilled Nursing Facility Barriers to Discharge: Continued Medical Work up  Expected Discharge Plan and Services Expected Discharge Plan: Skilled Nursing Facility In-house Referral: Clinical Social Work     Living arrangements for the past 2 months: Group Home Expected Discharge Date: 12/12/21                                     Social Determinants of Health (SDOH) Interventions    Readmission Risk Interventions No flowsheet data found.

## 2021-12-13 ENCOUNTER — Encounter: Payer: Self-pay | Admitting: Orthopedic Surgery

## 2021-12-13 DIAGNOSIS — F324 Major depressive disorder, single episode, in partial remission: Secondary | ICD-10-CM | POA: Diagnosis not present

## 2021-12-13 DIAGNOSIS — S82851D Displaced trimalleolar fracture of right lower leg, subsequent encounter for closed fracture with routine healing: Secondary | ICD-10-CM | POA: Diagnosis not present

## 2021-12-13 DIAGNOSIS — I10 Essential (primary) hypertension: Secondary | ICD-10-CM | POA: Diagnosis not present

## 2021-12-13 DIAGNOSIS — F200 Paranoid schizophrenia: Secondary | ICD-10-CM | POA: Diagnosis not present

## 2021-12-28 DIAGNOSIS — W010XXA Fall on same level from slipping, tripping and stumbling without subsequent striking against object, initial encounter: Secondary | ICD-10-CM | POA: Diagnosis not present

## 2021-12-28 DIAGNOSIS — S82851A Displaced trimalleolar fracture of right lower leg, initial encounter for closed fracture: Secondary | ICD-10-CM | POA: Diagnosis not present

## 2021-12-28 DIAGNOSIS — S82841A Displaced bimalleolar fracture of right lower leg, initial encounter for closed fracture: Secondary | ICD-10-CM | POA: Diagnosis not present

## 2022-01-04 ENCOUNTER — Emergency Department: Payer: PPO

## 2022-01-04 ENCOUNTER — Other Ambulatory Visit: Payer: Self-pay

## 2022-01-04 DIAGNOSIS — R7309 Other abnormal glucose: Secondary | ICD-10-CM | POA: Diagnosis not present

## 2022-01-04 DIAGNOSIS — I10 Essential (primary) hypertension: Secondary | ICD-10-CM | POA: Diagnosis not present

## 2022-01-04 DIAGNOSIS — W06XXXA Fall from bed, initial encounter: Secondary | ICD-10-CM | POA: Insufficient documentation

## 2022-01-04 DIAGNOSIS — S0990XA Unspecified injury of head, initial encounter: Secondary | ICD-10-CM | POA: Insufficient documentation

## 2022-01-04 DIAGNOSIS — R4182 Altered mental status, unspecified: Secondary | ICD-10-CM | POA: Insufficient documentation

## 2022-01-04 DIAGNOSIS — Z20822 Contact with and (suspected) exposure to covid-19: Secondary | ICD-10-CM | POA: Insufficient documentation

## 2022-01-04 DIAGNOSIS — Z9889 Other specified postprocedural states: Secondary | ICD-10-CM | POA: Diagnosis not present

## 2022-01-04 DIAGNOSIS — T426X1A Poisoning by other antiepileptic and sedative-hypnotic drugs, accidental (unintentional), initial encounter: Secondary | ICD-10-CM | POA: Insufficient documentation

## 2022-01-04 DIAGNOSIS — M47812 Spondylosis without myelopathy or radiculopathy, cervical region: Secondary | ICD-10-CM | POA: Diagnosis not present

## 2022-01-04 LAB — BASIC METABOLIC PANEL
Anion gap: 7 (ref 5–15)
BUN: 18 mg/dL (ref 6–20)
CO2: 30 mmol/L (ref 22–32)
Calcium: 8.8 mg/dL — ABNORMAL LOW (ref 8.9–10.3)
Chloride: 104 mmol/L (ref 98–111)
Creatinine, Ser: 1.15 mg/dL (ref 0.61–1.24)
GFR, Estimated: >60 mL/min (ref >60)
Glucose, Bld: 108 mg/dL — ABNORMAL HIGH (ref 70–99)
Potassium: 4.1 mmol/L (ref 3.5–5.1)
Sodium: 141 mmol/L (ref 135–145)

## 2022-01-04 LAB — CBC
HCT: 41.1 % (ref 39.0–52.0)
Hemoglobin: 13.2 g/dL (ref 13.0–17.0)
MCH: 31.7 pg (ref 26.0–34.0)
MCHC: 32.1 g/dL (ref 30.0–36.0)
MCV: 98.6 fL (ref 80.0–100.0)
Platelets: 190 10*3/uL (ref 150–400)
RBC: 4.17 MIL/uL — ABNORMAL LOW (ref 4.22–5.81)
RDW: 13.6 % (ref 11.5–15.5)
WBC: 8.5 10*3/uL (ref 4.0–10.5)
nRBC: 0 % (ref 0.0–0.2)

## 2022-01-04 NOTE — ED Triage Notes (Signed)
Pt sent from [facility] for eval after fall today at apx 12pm. Pt states hit head on a bedrail. Denies LOC. Pt says difficulty moving right leg now ?

## 2022-01-04 NOTE — ED Notes (Signed)
Per ems pt was getting into bed and fell, struck posterior skull, no loc, per ems pt is from [facility]. Per ems staff at facility would like pt evaluated for UTI as well.  ?

## 2022-01-05 ENCOUNTER — Emergency Department
Admission: EM | Admit: 2022-01-05 | Discharge: 2022-01-05 | Disposition: A | Discharge status: Skilled Nursing Facility | Payer: PPO | Attending: Emergency Medicine | Admitting: Emergency Medicine

## 2022-01-05 ENCOUNTER — Other Ambulatory Visit: Payer: Self-pay

## 2022-01-05 DIAGNOSIS — R4182 Altered mental status, unspecified: Secondary | ICD-10-CM

## 2022-01-05 DIAGNOSIS — R296 Repeated falls: Secondary | ICD-10-CM

## 2022-01-05 DIAGNOSIS — T426X1A Poisoning by other antiepileptic and sedative-hypnotic drugs, accidental (unintentional), initial encounter: Secondary | ICD-10-CM | POA: Insufficient documentation

## 2022-01-05 LAB — HEPATIC FUNCTION PANEL
ALT: 13 U/L (ref 0–44)
AST: 25 U/L (ref 15–41)
Albumin: 2.8 g/dL — ABNORMAL LOW (ref 3.5–5.0)
Alkaline Phosphatase: 44 U/L (ref 38–126)
Bilirubin, Direct: 0.2 mg/dL (ref 0.0–0.2)
Indirect Bilirubin: 0.4 mg/dL (ref 0.3–0.9)
Total Bilirubin: 0.6 mg/dL (ref 0.3–1.2)
Total Protein: 6.2 g/dL — ABNORMAL LOW (ref 6.5–8.1)

## 2022-01-05 LAB — RESP PANEL BY RT-PCR (FLU A&B, COVID) ARPGX2
Influenza A by PCR: NEGATIVE
Influenza B by PCR: NEGATIVE
SARS Coronavirus 2 by RT PCR: NEGATIVE

## 2022-01-05 LAB — URINALYSIS, ROUTINE W REFLEX MICROSCOPIC
Bacteria, UA: NONE SEEN
Bilirubin Urine: NEGATIVE
Glucose, UA: NEGATIVE mg/dL
Hgb urine dipstick: NEGATIVE
Ketones, ur: 5 mg/dL — AB
Nitrite: NEGATIVE
Protein, ur: NEGATIVE mg/dL
Specific Gravity, Urine: 1.024 (ref 1.005–1.030)
pH: 5 (ref 5.0–8.0)

## 2022-01-05 LAB — CBG MONITORING, ED: Glucose-Capillary: 88 mg/dL (ref 70–99)

## 2022-01-05 LAB — MAGNESIUM: Magnesium: 1.8 mg/dL (ref 1.7–2.4)

## 2022-01-05 LAB — SALICYLATE LEVEL: Salicylate Lvl: <7 mg/dL — ABNORMAL LOW (ref 7.0–30.0)

## 2022-01-05 LAB — ACETAMINOPHEN LEVEL: Acetaminophen (Tylenol), Serum: <10 ug/mL — ABNORMAL LOW (ref 10–30)

## 2022-01-05 LAB — AMMONIA: Ammonia: 23 umol/L (ref 9–35)

## 2022-01-05 LAB — VALPROIC ACID LEVEL
Valproic Acid Lvl: 136 ug/mL — ABNORMAL HIGH (ref 50.0–100.0)
Valproic Acid Lvl: 95 ug/mL (ref 50.0–100.0)

## 2022-01-05 LAB — TSH: TSH: 1.949 u[IU]/mL (ref 0.350–4.500)

## 2022-01-05 MED ORDER — SODIUM CHLORIDE 0.9 % IV BOLUS (SEPSIS)
2000.0000 mL | Freq: Once | INTRAVENOUS | Status: AC
  Administered 2022-01-05: 2000 mL via INTRAVENOUS

## 2022-01-05 MED ORDER — LORAZEPAM 2 MG/ML IJ SOLN
0.5000 mg | Freq: Once | INTRAMUSCULAR | Status: AC
  Administered 2022-01-05: 0.5 mg via INTRAVENOUS
  Filled 2022-01-05: qty 1

## 2022-01-05 MED ORDER — NALOXONE HCL 2 MG/2ML IJ SOSY
0.4000 mg | PREFILLED_SYRINGE | Freq: Once | INTRAMUSCULAR | Status: AC
Start: 2022-01-05 — End: 2022-01-05
  Administered 2022-01-05: 0.4 mg via INTRAVENOUS
  Filled 2022-01-05: qty 2

## 2022-01-05 MED ORDER — SODIUM CHLORIDE 0.9 % IV SOLN: INTRAVENOUS | Status: DC

## 2022-01-05 MED ORDER — LORAZEPAM 1 MG PO TABS: 1.0000 mg | ORAL_TABLET | Freq: Once | ORAL | Status: DC

## 2022-01-05 NOTE — BH Assessment (Signed)
This Clinical research associate spoke with Starling Manns (Mother/Legal Guardian) [phone] to update her on pt's plan of care. Kara Mead demonstrated an understanding of the information discussed. This writer reassured her that she would be updated about the pt's status as treatment progresses.  ?

## 2022-01-05 NOTE — Discharge Instructions (Signed)
Patient has been evaluated medically and by behavioral team. He appears to be at his baseline now and is cleared for d/c ? ?

## 2022-01-05 NOTE — ED Notes (Signed)
Pt is  currently able to communicate a bit clearer than earlier. Pt was able to eat breakfast with assistance from this tech.  ?

## 2022-01-05 NOTE — BH Assessment (Signed)
This writer attempted to assess pt along with TTS. Pt was disoriented and unable to participate in the assessment. Patient has been admitted medically and will be reassessed when medically cleared. ?

## 2022-01-05 NOTE — ED Notes (Signed)
Report to [facility] staff member. ?

## 2022-01-05 NOTE — ED Provider Notes (Signed)
[facility] Provider Note    Event Date/Time   First MD Initiated Contact with Patient 01/05/22 0206     (approximate)   History   Fall   HPI  [name] [name] is a 56 y.o. male with history of schizophrenia, depression, hypertension, asthma who presents to the emergency department with EMS from [facility] care after he had an unwitnessed fall out of bed.  He is on aspirin but no anticoagulants or other antiplatelets.  He denies having any pain at this time but is unable to tell me what happened.  He is able to tell me that he fell out of bed but does not know why.  His right lower extremity is in a cast.    I spoke with Robin at his nursing facility who states day time staff was concerned he was confused and wanted a UA and psychiatric evaluation.  Zella BallRobin states she was not on shift when the fall happened and has not seen him recently to comment on his confusion.  I spoke to his mother [name] [name] by phone.  Her number is [phone].  She states whenever he is having an issue with his schizophrenia and bipolar disorder he becomes confused.  Patient denies to me any SI, HI or hallucinations.  He denies any headache, chest pain, shortness of breath, abdominal pain.  History provided by patient, EMS, nursing home staff.    Past Medical History:  Diagnosis Date   Acute bronchitis    Asthma    Depression    Hypertension    Schizophrenia (HCC)     Past Surgical History:  Procedure Laterality Date   ORIF ANKLE FRACTURE Right 12/07/2021   Procedure: OPEN REDUCTION INTERNAL FIXATION (ORIF) ANKLE FRACTURE;  Surgeon: Juanell FairlyKrasinski, Kevin, MD;  Location: [facility] ORS;  Service: Orthopedics;  Laterality: Right;    MEDICATIONS:  Prior to Admission medications   Medication Sig Start Date End Date Taking? Authorizing Provider  albuterol (VENTOLIN HFA) 108 (90 Base) MCG/ACT inhaler Inhale 2 puffs into the lungs every 4 (four) hours as needed for cough or congestion. 08/06/21    [provider]  aspirin EC 325 MG tablet Take 1 tablet (325 mg total) by mouth daily. 12/11/21   Juanell FairlyKrasinski, Kevin, MD  benztropine (COGENTIN) 1 MG tablet Take 1 mg by mouth 3 (three) times daily. 10/04/21   [provider]  BREO ELLIPTA 200-25 MCG/ACT AEPB Inhale 1 puff into the lungs daily. 10/11/21   [provider]  cholecalciferol (VITAMIN D) 25 MCG (1000 UNIT) tablet Take 2,000 Units by mouth daily.    [provider]  divalproex (DEPAKOTE ER) 500 MG 24 hr tablet Take 2,000 mg by mouth at bedtime. 10/04/21   [provider]  docusate sodium (COLACE) 100 MG capsule Take 1 capsule (100 mg total) by mouth 2 (two) times daily. 12/11/21   Juanell FairlyKrasinski, Kevin, MD  metoprolol succinate (TOPROL-XL) 100 MG 24 hr tablet Take 1 tablet (100 mg total) by mouth daily. Take with or immediately following a meal. 12/12/21   Alford HighlandWieting, Richard, MD  Multiple Vitamins-Minerals (MULTIVITAMIN WITH MINERALS) tablet Take 1 tablet by mouth daily.    [provider]  omeprazole (PRILOSEC) 20 MG capsule Take 20 mg by mouth daily. 10/04/21   [provider]  oxyCODONE (OXY IR/ROXICODONE) 5 MG immediate release tablet Take 1-2 tablets (5-10 mg total) by mouth every 4 (four) hours as needed for breakthrough pain ((for MODERATE breakthrough pain)). 12/11/21   Juanell FairlyKrasinski, Kevin, MD  paliperidone (INVEGA) 6 MG 24 hr tablet Take 6 mg by mouth at bedtime. 10/04/21   [provider]    Physical Exam   Triage Vital Signs: ED Triage Vitals  Enc Vitals Group     BP 01/04/22 2014 (!) 143/84     Pulse Rate 01/04/22 2014 94     Resp 01/04/22 2014 18     Temp 01/04/22 2014 99.2 F (37.3 C)     Temp Source 01/04/22 2014 Oral     SpO2 01/04/22 2014 96 %     Weight --      Height --      Head Circumference --      Peak Flow --      Pain Score 01/04/22 2012 0     Pain Loc --      Pain Edu? --      Excl. in GC? --     Most recent vital signs: Vitals:   01/04/22 2014  01/04/22 2242  BP: (!) 143/84 (!) 129/97  Pulse: 94 93  Resp: 18 16  Temp: 99.2 F (37.3 C) 97.6 F (36.4 C)  SpO2: 96% 97%     CONSTITUTIONAL: Alert and answering questions appropriately.  Does not appear to be in distress.  Oriented x3 but very somnolent and falls asleep quickly. HEAD: Normocephalic; atraumatic EYES: Conjunctivae clear, PERRL, EOMI ENT: normal nose; no rhinorrhea; moist mucous membranes; pharynx without lesions noted; no dental injury; no septal hematoma, no epistaxis; no facial deformity or bony tenderness NECK: Supple, no midline spinal tenderness, step-off or deformity; trachea midline CARD: RRR; S1 and S2 appreciated; no murmurs, no clicks, no rubs, no gallops RESP: Normal chest excursion without splinting or tachypnea; breath sounds clear and equal bilaterally; no wheezes, no rhonchi, no rales; no hypoxia or respiratory distress CHEST:  chest wall stable, no crepitus or ecchymosis or deformity, nontender to palpation; no flail chest ABD/GI: Normal bowel sounds; non-distended; soft, non-tender, no rebound, no guarding; no ecchymosis or other lesions noted PELVIS:  stable, nontender to palpation BACK:  The back appears normal; no midline spinal tenderness, step-off or deformity EXT: Normal ROM in all joints; non-tender to palpation; no edema; normal capillary refill; no cyanosis, no bony tenderness or bony deformity of patient's extremities, no joint effusion, compartments are soft, extremities are warm and well-perfused, no ecchymosis, right lower extremity is in a short leg cast.  Compartments of the right leg are soft and he has normal capillary refill in the toes. SKIN: Normal color for age and race; warm NEURO: No facial asymmetry, normal speech, moving all extremities equally PSYCH: Denies SI or HI.  Does not appear to be responding to internal stimuli.  ED Results / Procedures / Treatments   LABS: (all labs ordered are listed, but only abnormal results are  displayed) Labs Reviewed  BASIC METABOLIC PANEL - Abnormal; Notable for the following components:      Result Value   Glucose, Bld 108 (*)    Calcium 8.8 (*)    All other components within normal limits  CBC - Abnormal; Notable for the following components:   RBC 4.17 (*)    All other components within normal limits  URINALYSIS, ROUTINE W REFLEX MICROSCOPIC - Abnormal; Notable for the following components:   Color, Urine YELLOW (*)    APPearance CLEAR (*)    Ketones, ur 5 (*)    Leukocytes,Ua MODERATE (*)    All other components within normal limits  VALPROIC ACID LEVEL - Abnormal;  Notable for the following components:   Valproic Acid Lvl 136 (*)    All other components within normal limits  HEPATIC FUNCTION PANEL - Abnormal; Notable for the following components:   Total Protein 6.2 (*)    Albumin 2.8 (*)    All other components within normal limits  ACETAMINOPHEN LEVEL - Abnormal; Notable for the following components:   Acetaminophen (Tylenol), Serum <10 (*)    All other components within normal limits  SALICYLATE LEVEL - Abnormal; Notable for the following components:   Salicylate Lvl <7.0 (*)    All other components within normal limits  RESP PANEL BY RT-PCR (FLU A&B, COVID) ARPGX2  AMMONIA  TSH  MAGNESIUM  VALPROIC ACID LEVEL  CBG MONITORING, ED     EKG:  EKG Interpretation  Date/Time:  Saturday January 05 2022 02:29:54 EST Ventricular Rate:  92 PR Interval:  126 QRS Duration: 70 QT Interval:  346 QTC Calculation: 427 R Axis:   69 Text Interpretation: Sinus rhythm with occasional Premature ventricular complexes Nonspecific ST and T wave abnormality Abnormal ECG When compared with ECG of 24-Apr-2018 17:34, Premature ventricular complexes are now Present ST now depressed in Anterior leads T wave amplitude has decreased in Inferior leads Nonspecific T wave abnormality, worse in Anterior leads Confirmed by Rochele Raring [phone]) on 01/05/2022 2:39:14 AM           RADIOLOGY: My personal review and interpretation of imaging: CT head and cervical spine show no acute traumatic injury.  I have personally reviewed all radiology reports. CT HEAD WO CONTRAST  Result Date: 01/04/2022 CLINICAL DATA:  Larey Seat out of bed, hit posterior head EXAM: CT HEAD WITHOUT CONTRAST TECHNIQUE: Contiguous axial images were obtained from the base of the skull through the vertex without intravenous contrast. RADIATION DOSE REDUCTION: This exam was performed according to the departmental dose-optimization program which includes automated exposure control, adjustment of the mA and/or kV according to patient size and/or use of iterative reconstruction technique. COMPARISON:  None. FINDINGS: Brain: No acute infarct or hemorrhage. Lateral ventricles and midline structures are unremarkable. No acute extra-axial fluid collections. No mass effect. Vascular: No hyperdense vessel or unexpected calcification. Skull: Normal. Negative for fracture or focal lesion. 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 Near complete opacification of the sphenoid sinus. Other: None. IMPRESSION: 1. No acute intracranial process. Electronically Signed   By: Sharlet Salina M.D.   On: 01/04/2022 20:57   CT CERVICAL SPINE WO CONTRAST  Result Date: 01/04/2022 CLINICAL DATA:  Larey Seat out of bed, hit head EXAM: CT CERVICAL SPINE WITHOUT CONTRAST TECHNIQUE: Multidetector CT imaging of the cervical spine was performed without intravenous contrast. Multiplanar CT image reconstructions were also generated. RADIATION DOSE REDUCTION: This exam was performed according to the departmental dose-optimization program which includes automated exposure control, adjustment of the mA and/or kV according to patient size and/or use of iterative reconstruction technique. COMPARISON:  None. FINDINGS: Alignment: Alignment is anatomic. Skull base and vertebrae: No acute fracture. No primary bone lesion or focal  pathologic process. Soft tissues and spinal canal: No prevertebral fluid or swelling. No visible canal hematoma. Disc levels: Mild cervical spondylosis greatest at the C5-6 level. Prominent facet hypertrophy at C2-3 and C3-4. Upper chest: Airway is patent. Visualized portions of the lung apices are clear. Other: Reconstructed images demonstrate no additional findings. IMPRESSION: 1. No acute cervical spine fracture. Electronically Signed   By: Sharlet Salina M.D.   On: 01/04/2022 20:58     PROCEDURES:  Critical Care performed: No  CRITICAL CARE Performed by: Rochele Raring   Total critical care time: 0 minutes  Critical care time was exclusive of separately billable procedures and treating other patients.  Critical care was necessary to treat or prevent imminent or life-threatening deterioration.  Critical care was time spent personally by me on the following activities: development of treatment plan with patient and/or surrogate as well as nursing, discussions with consultants, evaluation of patient's response to treatment, examination of patient, obtaining history from patient or surrogate, ordering and performing treatments and interventions, ordering and review of laboratory studies, ordering and review of radiographic studies, pulse oximetry and re-evaluation of patient's condition.   Procedures    IMPRESSION / MDM / ASSESSMENT AND PLAN / ED COURSE  I reviewed the triage vital signs and the nursing notes.  Patient here from [facility] care after he had an unwitnessed fall.  He denies any pain at this time.     DIFFERENTIAL DIAGNOSIS (includes but not limited to):   Unwitnessed fall, head injury, concussion, skull fracture, intracranial hemorrhage, cervical spine fracture, electrolyte derangement, anemia, ACS, arrhythmia, UTI   PLAN: We will obtain CBC, BMP, check Depakote level, obtain urinalysis, CT of the head and cervical spine, EKG.  He denies any pain at this time.   We will continue to closely monitor.   MEDICATIONS GIVEN IN ED: Medications  naloxone [facility]) injection 0.4 mg (0.4 mg Intravenous Given 01/05/22 0239)  sodium chloride 0.9 % bolus 2,000 mL (0 mLs Intravenous Stopped 01/05/22 4174)     ED COURSE: Patient has no leukocytosis.  Normal hemoglobin.  Normal electrolytes, renal function, glucose.  Urine shows moderate leukocytes but no other sign of infection.  CT of the head and cervical spine reviewed by myself and radiologist and showed no acute traumatic injury.  Patient seems drowsy, somnolent here but able to answer orientation questions.  He denies SI, HI and does not appear to be responding to internal stimuli.  Nursing home staff was concerned that he needed a psychiatric evaluation.  I have placed a consult for psychiatry and TTS.  Mother states when he gets like this it is due to his schizophrenia and bipolar.  His blood glucose is normal.  He is on pain medication due to his recent ankle fracture.  We will give a small dose of Narcan to see if this improves his mental status.   3:10 AM  Pt did not seem to have any significant change after Narcan but does seem more restless.  Seen by psychiatry and TTS.  They will talk to his mother and likely reassess patient in the morning for further disposition.  4:00 AM  Pt now more awake and talkative.  I wonder if he was just overly sedated from opiates.  His valproic acid level has come back in the 130s.  I did discuss this with Patty with poison control who recommends checking an ammonia level, hydrating patient and then rechecking a valproic acid level in 6 hours.  Patient has been seen by the psychiatric team.  I have updated them as well.  Patient will need medical and psychiatric reassessment in the morning but if he continues to do well and his labs look like they are improving I anticipate that he will be able to be discharged back to [facility].  7:20 AM  Pt mental status seems like it has  continued to improve.  Repeat valproic acid level ordered at 8:10 AM.  Signed out the oncoming ED provider.  I  reviewed all nursing notes and pertinent previous records as available.  I have reviewed and interpreted any and all EKGs, lab and urine results, imaging and radiology reports (as available).   CONSULTS: Psychiatry and TTS consulted.   OUTSIDE RECORDS REVIEWED: Reviewed patient's last admission on 12/06/2021 to 12/12/2021 for closed trimalleolar fracture of the right ankle that underwent ORIF with Dr. Martha Clan.         FINAL CLINICAL IMPRESSION(S) / ED DIAGNOSES   Final diagnoses:  Unwitnessed fall  Valproic acid toxicity, accidental or unintentional, initial encounter  Altered mental status, unspecified altered mental status type     Rx / DC Orders   ED Discharge Orders     None        Note:  This document was prepared using Dragon voice recognition software and may include unintentional dictation errors.   Ammie Warrick, Layla Maw, DO 01/05/22 [phone]

## 2022-01-05 NOTE — ED Notes (Addendum)
Pt very restless in bd. MD made aware of needing some medication to calm him. Pt is unable to communicate with staff. Altered mental status .  ?

## 2022-01-05 NOTE — ED Provider Notes (Signed)
Patient alert, cooperative, eating breakfast.  Seen by behavioral team, they agree with discharge ?  ?Jene Every, MD ?01/05/22 [phone] ? ?

## 2022-01-05 NOTE — BH Assessment (Signed)
This writer attempted to assess pt along with psych NP Rashaun D. Pt was disoriented and unable to participate in the assessment. Consulted with psych MD Dr. Elesa Massed and the plan is for psych team to follow up when pt is deemed medically cleared.  ?

## 2022-01-05 NOTE — ED Notes (Signed)
Poison control called to clear patient.  ?

## 2022-01-05 NOTE — ED Notes (Signed)
Pt is very restless ?

## 2022-01-05 NOTE — ED Notes (Signed)
First normal saline 1057mL hung at this time ?

## 2022-01-05 NOTE — Consult Note (Cosign Needed)
[facility]   Reason for Consult:  head injury with AMS Referring Physician:  EDP Patient Identification: [name] [name] MRN:  [id number] Principal Diagnosis: Schizophrenia, paranoid type Diagnosis:  Schizophrenia, paranoid type   Total Time spent with patient: 45 minutes  Subjective:   [name] [name] is a 56 y.o. male patient admitted with head injury related to a fall and AMS.  HPI:  56 yo male who presented from his facility after falling and hitting his head, experienced AMS.  History of schizophrenia.  Today, he is sitting up in bed eating and following directions from his sitter without issues.  Denies suicidal/homicidal ideations, hallucinations, and substance abuse.  Smiles appropriately, Depakote level WNL along with his TSH levels.  Psychiatrically stable to return to his facility when medically cleared.   Past Psychiatric History: schizophrenia  Risk to Self:  none Risk to Others:  none Prior Inpatient Therapy:  past Prior Outpatient Therapy:  yes  Past Medical History:  Past Medical History:  Diagnosis Date   Acute bronchitis    Asthma    Depression    Hypertension    Schizophrenia (HCC)     Past Surgical History:  Procedure Laterality Date   ORIF ANKLE FRACTURE Right 12/07/2021   Procedure: OPEN REDUCTION INTERNAL FIXATION (ORIF) ANKLE FRACTURE;  Surgeon: Juanell FairlyKrasinski, Kevin, MD;  Location: [facility] ORS;  Service: Orthopedics;  Laterality: Right;   Family History: No family history on file. Family Psychiatric  History: unknown Social History:  Social History   Substance and Sexual Activity  Alcohol Use Not Currently     Social History   Substance and Sexual Activity  Drug Use Not Currently    Social History   Socioeconomic History   Marital status: Legally Separated    Spouse name: Not on file   Number of children: Not on file   Years of education: Not on file   Highest education level: Not on file  Occupational History   Not  on file  Tobacco Use   Smoking status: Former   Smokeless tobacco: Not on file  Substance and Sexual Activity   Alcohol use: Not Currently   Drug use: Not Currently   Sexual activity: Not on file  Other Topics Concern   Not on file  Social History Narrative   Not on file   Social Determinants of Health   Financial Resource Strain: Not on file  Food Insecurity: Not on file  Transportation Needs: Not on file  Physical Activity: Not on file  Stress: Not on file  Social Connections: Not on file   Additional Social History:    Allergies:   Allergies  Allergen Reactions   Cefprozil    Cetirizine Hcl    Doxycycline     Labs:  Results for orders placed or performed during the hospital encounter of 01/05/22 (from the past 48 hour(s))  Basic metabolic panel     Status: Abnormal   Collection Time: 01/04/22  8:09 PM  Result Value Ref Range   Sodium 141 135 - 145 mmol/L   Potassium 4.1 3.5 - 5.1 mmol/L   Chloride 104 98 - 111 mmol/L   CO2 30 22 - 32 mmol/L   Glucose, Bld 108 (H) 70 - 99 mg/dL    Comment: Glucose reference range applies only to samples taken after fasting for at least 8 hours.   BUN 18 6 - 20 mg/dL   Creatinine, Ser 5.621.15 0.61 - 1.24 mg/dL   Calcium 8.8 (L) 8.9 -  10.3 mg/dL   GFR, Estimated >56 >21 mL/min    Comment: (NOTE) Calculated using the CKD-EPI Creatinine Equation (2021)    Anion gap 7 5 - 15    Comment: Performed at [facility], [address] [place], [place] [address]  CBC     Status: Abnormal   Collection Time: 01/04/22  8:09 PM  Result Value Ref Range   WBC 8.5 4.0 - 10.5 K/uL   RBC 4.17 (L) 4.22 - 5.81 MIL/uL   Hemoglobin 13.2 13.0 - 17.0 g/dL   HCT 78.4 69.6 - 29.5 %   MCV 98.6 80.0 - 100.0 fL   MCH 31.7 26.0 - 34.0 pg   MCHC 32.1 30.0 - 36.0 g/dL   RDW 28.4 13.2 - 44.0 %   Platelets 190 150 - 400 K/uL   nRBC 0.0 0.0 - 0.2 %    Comment: Performed at [facility], [address] [place], [place] [address]   Urinalysis, Routine w reflex microscopic     Status: Abnormal   Collection Time: 01/04/22  8:09 PM  Result Value Ref Range   Color, Urine YELLOW (A) YELLOW   APPearance CLEAR (A) CLEAR   Specific Gravity, Urine 1.024 1.005 - 1.030   pH 5.0 5.0 - 8.0   Glucose, UA NEGATIVE NEGATIVE mg/dL   Hgb urine dipstick NEGATIVE NEGATIVE   Bilirubin Urine NEGATIVE NEGATIVE   Ketones, ur 5 (A) NEGATIVE mg/dL   Protein, ur NEGATIVE NEGATIVE mg/dL   Nitrite NEGATIVE NEGATIVE   Leukocytes,Ua MODERATE (A) NEGATIVE   RBC / HPF 0-5 0 - 5 RBC/hpf   WBC, UA 0-5 0 - 5 WBC/hpf   Bacteria, UA NONE SEEN NONE SEEN   Squamous Epithelial / LPF 0-5 0 - 5   Mucus PRESENT     Comment: Performed at [facility], [address] [place], [place] [address]  Valproic acid level     Status: Abnormal   Collection Time: 01/05/22  2:10 AM  Result Value Ref Range   Valproic Acid Lvl 136 (H) 50.0 - 100.0 ug/mL    Comment: Performed at [facility], [address], [place], [place] [address]  Resp Panel by RT-PCR (Flu A&B, Covid) Nasopharyngeal Swab     Status: None   Collection Time: 01/05/22  2:32 AM   Specimen: Nasopharyngeal Swab; Nasopharyngeal(NP) swabs in vial transport medium  Result Value Ref Range   SARS Coronavirus 2 by RT PCR NEGATIVE NEGATIVE    Comment: (NOTE) SARS-CoV-2 target nucleic acids are NOT DETECTED.  The SARS-CoV-2 RNA is generally detectable in upper respiratory specimens during the acute phase of infection. The lowest concentration of SARS-CoV-2 viral copies this assay can detect is 138 copies/mL. A negative result does not preclude SARS-Cov-2 infection and should not be used as the sole basis for treatment or other patient management decisions. A negative result may occur with  improper specimen collection/handling, submission of specimen other than nasopharyngeal swab, presence of viral mutation(s) within the areas targeted by this assay, and inadequate number of  viral copies(<138 copies/mL). A negative result must be combined with clinical observations, patient history, and epidemiological information. The expected result is Negative.  Fact Sheet for Patients:  BloggerCourse.com  Fact Sheet for Healthcare Providers:  SeriousBroker.it  This test is no t yet approved or cleared by the [place] FDA and  has been authorized for detection and/or diagnosis of SARS-CoV-2 by FDA under an Emergency Use Authorization (EUA). This EUA will remain  in  effect (meaning this test can be used) for the duration of the COVID-19 declaration under Section 564(b)(1) of the Act, 21 U.S.C.section 360bbb-3(b)(1), unless the authorization is terminated  or revoked sooner.       Influenza A by PCR NEGATIVE NEGATIVE   Influenza B by PCR NEGATIVE NEGATIVE    Comment: (NOTE) The Xpert Xpress SARS-CoV-2/FLU/RSV plus assay is intended as an aid in the diagnosis of influenza from Nasopharyngeal swab specimens and should not be used as a sole basis for treatment. Nasal washings and aspirates are unacceptable for Xpert Xpress SARS-CoV-2/FLU/RSV testing.  Fact Sheet for Patients: BloggerCourse.com  Fact Sheet for Healthcare Providers: SeriousBroker.it  This test is not yet approved or cleared by the [place] FDA and has been authorized for detection and/or diagnosis of SARS-CoV-2 by FDA under an Emergency Use Authorization (EUA). This EUA will remain in effect (meaning this test can be used) for the duration of the COVID-19 declaration under Section 564(b)(1) of the Act, 21 U.S.C. section 360bbb-3(b)(1), unless the authorization is terminated or revoked.  Performed at [facility], [address] [place], [place] [address]   CBG monitoring, ED     Status: None   Collection Time: 01/05/22  2:37 AM  Result Value Ref Range   Glucose-Capillary 88  70 - 99 mg/dL    Comment: Glucose reference range applies only to samples taken after fasting for at least 8 hours.  Magnesium     Status: None   Collection Time: 01/05/22  3:30 AM  Result Value Ref Range   Magnesium 1.8 1.7 - 2.4 mg/dL    Comment: Performed at [facility], [address] [place], [place] [address]  Ammonia     Status: None   Collection Time: 01/05/22  3:31 AM  Result Value Ref Range   Ammonia 23 9 - 35 umol/L    Comment: Performed at [facility], [address] [place], [place] [address]  TSH     Status: None   Collection Time: 01/05/22  3:31 AM  Result Value Ref Range   TSH 1.949 0.350 - 4.500 uIU/mL    Comment: Performed by a 3rd Generation assay with a functional sensitivity of <=0.01 uIU/mL. Performed at [facility], [address] [place], [place] [address]   Hepatic function panel     Status: Abnormal   Collection Time: 01/05/22  3:31 AM  Result Value Ref Range   Total Protein 6.2 (L) 6.5 - 8.1 g/dL   Albumin 2.8 (L) 3.5 - 5.0 g/dL   AST 25 15 - 41 U/L   ALT 13 0 - 44 U/L   Alkaline Phosphatase 44 38 - 126 U/L   Total Bilirubin 0.6 0.3 - 1.2 mg/dL   Bilirubin, Direct 0.2 0.0 - 0.2 mg/dL   Indirect Bilirubin 0.4 0.3 - 0.9 mg/dL    Comment: Performed at [facility], [address] [place], [place] [address]  Acetaminophen level     Status: Abnormal   Collection Time: 01/05/22  4:20 AM  Result Value Ref Range   Acetaminophen (Tylenol), Serum <10 (L) 10 - 30 ug/mL    Comment: (NOTE) Therapeutic concentrations vary significantly. A range of 10-30 ug/mL  may be an effective concentration for many patients. However, some  are best treated at concentrations outside of this range. Acetaminophen concentrations >150 ug/mL at 4 hours after ingestion  and >50 ug/mL at 12 hours after ingestion are often associated with  toxic reactions.  Performed at  [facility], [address] [place], [place]  [id number]   Salicylate level     Status: Abnormal   Collection Time: 01/05/22  4:20 AM  Result Value Ref Range   Salicylate Lvl <7.0 (L) 7.0 - 30.0 mg/dL    Comment: Performed at [facility], [address] [place], [place] [address]  Valproic acid level     Status: None   Collection Time: 01/05/22  7:55 AM  Result Value Ref Range   Valproic Acid Lvl 95 50.0 - 100.0 ug/mL    Comment: Performed at [facility], [address] [place], [place] [address]    Current Facility-Administered Medications  Medication Dose Route Frequency Provider Last Rate Last Admin   0.9 %  sodium chloride infusion   Intravenous Continuous Ward, Layla MawKristen N, DO   Stopped at 01/05/22 [id number]   Current Outpatient Medications  Medication Sig Dispense Refill   albuterol (VENTOLIN HFA) 108 (90 Base) MCG/ACT inhaler Inhale 2 puffs into the lungs every 4 (four) hours as needed for cough or congestion.     aspirin EC 325 MG tablet Take 1 tablet (325 mg total) by mouth daily. 30 tablet 0   benztropine (COGENTIN) 1 MG tablet Take 1 mg by mouth 3 (three) times daily.     BREO ELLIPTA 200-25 MCG/ACT AEPB Inhale 1 puff into the lungs daily.     cholecalciferol (VITAMIN D) 25 MCG (1000 UNIT) tablet Take 2,000 Units by mouth daily.     divalproex (DEPAKOTE ER) 500 MG 24 hr tablet Take 2,000 mg by mouth at bedtime.     docusate sodium (COLACE) 100 MG capsule Take 1 capsule (100 mg total) by mouth 2 (two) times daily. 10 capsule 0   metoprolol succinate (TOPROL-XL) 100 MG 24 hr tablet Take 1 tablet (100 mg total) by mouth daily. Take with or immediately following a meal. 30 tablet 0   Multiple Vitamins-Minerals (MULTIVITAMIN WITH MINERALS) tablet Take 1 tablet by mouth daily.     omeprazole (PRILOSEC) 20 MG capsule Take 20 mg by mouth daily.     oxyCODONE (OXY IR/ROXICODONE) 5 MG immediate release tablet Take 1-2 tablets (5-10 mg total) by mouth every 4 (four) hours as needed for breakthrough pain ((for MODERATE  breakthrough pain)). 30 tablet 0   paliperidone (INVEGA) 6 MG 24 hr tablet Take 6 mg by mouth at bedtime.      Musculoskeletal: Strength & Muscle Tone: decreased Gait & Station:  did not witness Patient leans: N/A  Psychiatric Specialty Exam: Physical Exam Vitals and nursing note reviewed.  Constitutional:      Appearance: Normal appearance.  HENT:     Head: Normocephalic.     Nose: Nose normal.  Pulmonary:     Effort: Pulmonary effort is normal.  Musculoskeletal:        General: Normal range of motion.     Cervical back: Normal range of motion.  Neurological:     General: No focal deficit present.     Mental Status: He is alert and oriented to person, place, and time.  Psychiatric:        Attention and Perception: Attention and perception normal.        Mood and Affect: Mood and affect normal.        Speech: Speech normal.        Behavior: Behavior normal. Behavior is cooperative.        Thought Content: Thought content normal.  Cognition and Memory: Cognition and memory normal.        Judgment: Judgment normal.    Review of Systems  All other systems reviewed and are negative.  Blood pressure 123/90, pulse 95, temperature 97.6 F (36.4 C), resp. rate 16, SpO2 93 %.There is no height or weight on file to calculate BMI.  General Appearance: Casual  Eye Contact:  Good  Speech:  Normal Rate  Volume:  Decreased  Mood:  Euthymic  Affect:  Blunt  Thought Process:  Coherent and Descriptions of Associations: Intact  Orientation:  Full (Time, Place, and Person)  Thought Content:  Logical  Suicidal Thoughts:  No  Homicidal Thoughts:  No  Memory:  Immediate;   Fair Recent;   Fair Remote;   Fair  Judgement:  Fair  Insight:  Fair  Psychomotor Activity:  Decreased  Concentration:  Concentration: Fair and Attention Span: Fair  Recall:  [employer] of Knowledge:  Fair  Language:  Good  Akathisia:  No  Handed:  Right  AIMS (if indicated):     Assets:   Housing Leisure Time Physical Health Resilience Social Support  ADL's:  Intact  Cognition:  WNL  Sleep:      cl   Physical Exam: Physical Exam Vitals and nursing note reviewed.  Constitutional:      Appearance: Normal appearance.  HENT:     Head: Normocephalic.     Nose: Nose normal.  Pulmonary:     Effort: Pulmonary effort is normal.  Musculoskeletal:        General: Normal range of motion.     Cervical back: Normal range of motion.  Neurological:     General: No focal deficit present.     Mental Status: He is alert and oriented to person, place, and time.  Psychiatric:        Attention and Perception: Attention and perception normal.        Mood and Affect: Mood and affect normal.        Speech: Speech normal.        Behavior: Behavior normal. Behavior is cooperative.        Thought Content: Thought content normal.        Cognition and Memory: Cognition and memory normal.        Judgment: Judgment normal.   Review of Systems  All other systems reviewed and are negative. Blood pressure 123/90, pulse 95, temperature 97.6 F (36.4 C), resp. rate 16, SpO2 93 %. There is no height or weight on file to calculate BMI.  Treatment Plan Summary: Schizophrenia, paranoid type: Continue Invega 6 mg daily Continue Depakote 2000 mg at bedtime  EPS: Continue Cogentin 1 mg TID   Disposition: No evidence of imminent risk to self or others at present.   Patient does not meet criteria for psychiatric inpatient admission.  Nanine Means, NP 01/05/2022 10:03 AM

## 2022-01-09 DIAGNOSIS — S82851D Displaced trimalleolar fracture of right lower leg, subsequent encounter for closed fracture with routine healing: Secondary | ICD-10-CM | POA: Diagnosis not present

## 2022-01-09 DIAGNOSIS — I10 Essential (primary) hypertension: Secondary | ICD-10-CM | POA: Diagnosis not present

## 2022-01-09 DIAGNOSIS — F200 Paranoid schizophrenia: Secondary | ICD-10-CM | POA: Diagnosis not present

## 2022-01-09 DIAGNOSIS — R5381 Other malaise: Secondary | ICD-10-CM | POA: Diagnosis not present

## 2022-01-15 DIAGNOSIS — I82431 Acute embolism and thrombosis of right popliteal vein: Secondary | ICD-10-CM | POA: Diagnosis not present

## 2022-01-15 DIAGNOSIS — F200 Paranoid schizophrenia: Secondary | ICD-10-CM | POA: Diagnosis not present

## 2022-01-15 DIAGNOSIS — W19XXXD Unspecified fall, subsequent encounter: Secondary | ICD-10-CM | POA: Diagnosis not present

## 2022-01-15 DIAGNOSIS — S82851D Displaced trimalleolar fracture of right lower leg, subsequent encounter for closed fracture with routine healing: Secondary | ICD-10-CM | POA: Diagnosis not present

## 2022-01-15 DIAGNOSIS — I10 Essential (primary) hypertension: Secondary | ICD-10-CM | POA: Diagnosis not present

## 2022-01-15 DIAGNOSIS — F324 Major depressive disorder, single episode, in partial remission: Secondary | ICD-10-CM | POA: Diagnosis not present

## 2022-01-15 DIAGNOSIS — J45909 Unspecified asthma, uncomplicated: Secondary | ICD-10-CM | POA: Diagnosis not present

## 2022-01-17 DIAGNOSIS — S82851D Displaced trimalleolar fracture of right lower leg, subsequent encounter for closed fracture with routine healing: Secondary | ICD-10-CM | POA: Diagnosis not present

## 2022-01-17 DIAGNOSIS — M6259 Muscle wasting and atrophy, not elsewhere classified, multiple sites: Secondary | ICD-10-CM | POA: Diagnosis not present

## 2022-01-17 DIAGNOSIS — R2681 Unsteadiness on feet: Secondary | ICD-10-CM | POA: Diagnosis not present

## 2022-01-23 DIAGNOSIS — B372 Candidiasis of skin and nail: Secondary | ICD-10-CM | POA: Diagnosis not present

## 2022-01-23 DIAGNOSIS — I10 Essential (primary) hypertension: Secondary | ICD-10-CM | POA: Diagnosis not present

## 2022-01-23 DIAGNOSIS — S82851A Displaced trimalleolar fracture of right lower leg, initial encounter for closed fracture: Secondary | ICD-10-CM | POA: Diagnosis not present

## 2022-01-23 DIAGNOSIS — E559 Vitamin D deficiency, unspecified: Secondary | ICD-10-CM | POA: Diagnosis not present

## 2022-01-23 DIAGNOSIS — W19XXXA Unspecified fall, initial encounter: Secondary | ICD-10-CM | POA: Diagnosis not present

## 2022-01-23 DIAGNOSIS — R0602 Shortness of breath: Secondary | ICD-10-CM | POA: Diagnosis not present

## 2022-01-23 DIAGNOSIS — R062 Wheezing: Secondary | ICD-10-CM | POA: Diagnosis not present

## 2022-01-23 DIAGNOSIS — K219 Gastro-esophageal reflux disease without esophagitis: Secondary | ICD-10-CM | POA: Diagnosis not present

## 2022-01-23 DIAGNOSIS — F209 Schizophrenia, unspecified: Secondary | ICD-10-CM | POA: Diagnosis not present

## 2022-01-23 DIAGNOSIS — Z79899 Other long term (current) drug therapy: Secondary | ICD-10-CM | POA: Diagnosis not present

## 2022-01-23 DIAGNOSIS — F319 Bipolar disorder, unspecified: Secondary | ICD-10-CM | POA: Diagnosis not present

## 2022-01-23 DIAGNOSIS — J4540 Moderate persistent asthma, uncomplicated: Secondary | ICD-10-CM | POA: Diagnosis not present

## 2022-01-28 DIAGNOSIS — R0602 Shortness of breath: Secondary | ICD-10-CM | POA: Diagnosis not present

## 2022-01-28 DIAGNOSIS — D519 Vitamin B12 deficiency anemia, unspecified: Secondary | ICD-10-CM | POA: Diagnosis not present

## 2022-02-04 DIAGNOSIS — J45909 Unspecified asthma, uncomplicated: Secondary | ICD-10-CM | POA: Diagnosis not present

## 2022-02-04 DIAGNOSIS — F324 Major depressive disorder, single episode, in partial remission: Secondary | ICD-10-CM | POA: Diagnosis not present

## 2022-02-04 DIAGNOSIS — W19XXXD Unspecified fall, subsequent encounter: Secondary | ICD-10-CM | POA: Diagnosis not present

## 2022-02-04 DIAGNOSIS — I10 Essential (primary) hypertension: Secondary | ICD-10-CM | POA: Diagnosis not present

## 2022-02-04 DIAGNOSIS — S82851D Displaced trimalleolar fracture of right lower leg, subsequent encounter for closed fracture with routine healing: Secondary | ICD-10-CM | POA: Diagnosis not present

## 2022-02-04 DIAGNOSIS — F200 Paranoid schizophrenia: Secondary | ICD-10-CM | POA: Diagnosis not present

## 2022-02-04 DIAGNOSIS — I82431 Acute embolism and thrombosis of right popliteal vein: Secondary | ICD-10-CM | POA: Diagnosis not present

## 2022-02-15 DIAGNOSIS — R4189 Other symptoms and signs involving cognitive functions and awareness: Secondary | ICD-10-CM | POA: Diagnosis not present

## 2022-02-15 DIAGNOSIS — F819 Developmental disorder of scholastic skills, unspecified: Secondary | ICD-10-CM | POA: Diagnosis not present

## 2022-02-15 DIAGNOSIS — F209 Schizophrenia, unspecified: Secondary | ICD-10-CM | POA: Diagnosis not present

## 2022-02-17 DIAGNOSIS — M6259 Muscle wasting and atrophy, not elsewhere classified, multiple sites: Secondary | ICD-10-CM | POA: Diagnosis not present

## 2022-02-17 DIAGNOSIS — R2681 Unsteadiness on feet: Secondary | ICD-10-CM | POA: Diagnosis not present

## 2022-02-17 DIAGNOSIS — S82851D Displaced trimalleolar fracture of right lower leg, subsequent encounter for closed fracture with routine healing: Secondary | ICD-10-CM | POA: Diagnosis not present

## 2022-02-19 DIAGNOSIS — S82851A Displaced trimalleolar fracture of right lower leg, initial encounter for closed fracture: Secondary | ICD-10-CM | POA: Diagnosis not present

## 2022-03-01 DIAGNOSIS — J219 Acute bronchiolitis, unspecified: Secondary | ICD-10-CM | POA: Diagnosis not present

## 2022-03-01 DIAGNOSIS — R062 Wheezing: Secondary | ICD-10-CM | POA: Diagnosis not present

## 2022-03-01 DIAGNOSIS — I10 Essential (primary) hypertension: Secondary | ICD-10-CM | POA: Diagnosis not present

## 2022-03-01 DIAGNOSIS — R059 Cough, unspecified: Secondary | ICD-10-CM | POA: Diagnosis not present

## 2022-03-04 DIAGNOSIS — Z4789 Encounter for other orthopedic aftercare: Secondary | ICD-10-CM | POA: Diagnosis not present

## 2022-03-04 DIAGNOSIS — Z7982 Long term (current) use of aspirin: Secondary | ICD-10-CM | POA: Diagnosis not present

## 2022-03-04 DIAGNOSIS — F324 Major depressive disorder, single episode, in partial remission: Secondary | ICD-10-CM | POA: Diagnosis not present

## 2022-03-04 DIAGNOSIS — I82431 Acute embolism and thrombosis of right popliteal vein: Secondary | ICD-10-CM | POA: Diagnosis not present

## 2022-03-04 DIAGNOSIS — J45909 Unspecified asthma, uncomplicated: Secondary | ICD-10-CM | POA: Diagnosis not present

## 2022-03-04 DIAGNOSIS — W19XXXD Unspecified fall, subsequent encounter: Secondary | ICD-10-CM | POA: Diagnosis not present

## 2022-03-04 DIAGNOSIS — I10 Essential (primary) hypertension: Secondary | ICD-10-CM | POA: Diagnosis not present

## 2022-03-04 DIAGNOSIS — F200 Paranoid schizophrenia: Secondary | ICD-10-CM | POA: Diagnosis not present

## 2022-03-18 DIAGNOSIS — I10 Essential (primary) hypertension: Secondary | ICD-10-CM | POA: Diagnosis not present

## 2022-03-18 DIAGNOSIS — D519 Vitamin B12 deficiency anemia, unspecified: Secondary | ICD-10-CM | POA: Diagnosis not present

## 2022-03-18 DIAGNOSIS — E559 Vitamin D deficiency, unspecified: Secondary | ICD-10-CM | POA: Diagnosis not present

## 2022-03-18 DIAGNOSIS — E119 Type 2 diabetes mellitus without complications: Secondary | ICD-10-CM | POA: Diagnosis not present

## 2022-03-18 DIAGNOSIS — F209 Schizophrenia, unspecified: Secondary | ICD-10-CM | POA: Diagnosis not present

## 2022-03-19 DIAGNOSIS — M6259 Muscle wasting and atrophy, not elsewhere classified, multiple sites: Secondary | ICD-10-CM | POA: Diagnosis not present

## 2022-03-19 DIAGNOSIS — R2681 Unsteadiness on feet: Secondary | ICD-10-CM | POA: Diagnosis not present

## 2022-03-19 DIAGNOSIS — S82851D Displaced trimalleolar fracture of right lower leg, subsequent encounter for closed fracture with routine healing: Secondary | ICD-10-CM | POA: Diagnosis not present

## 2022-03-20 DIAGNOSIS — S82851A Displaced trimalleolar fracture of right lower leg, initial encounter for closed fracture: Secondary | ICD-10-CM | POA: Diagnosis not present

## 2022-04-04 DIAGNOSIS — W19XXXD Unspecified fall, subsequent encounter: Secondary | ICD-10-CM | POA: Diagnosis not present

## 2022-04-04 DIAGNOSIS — J45909 Unspecified asthma, uncomplicated: Secondary | ICD-10-CM | POA: Diagnosis not present

## 2022-04-04 DIAGNOSIS — M79675 Pain in left toe(s): Secondary | ICD-10-CM | POA: Diagnosis not present

## 2022-04-04 DIAGNOSIS — R2681 Unsteadiness on feet: Secondary | ICD-10-CM | POA: Diagnosis not present

## 2022-04-04 DIAGNOSIS — I82431 Acute embolism and thrombosis of right popliteal vein: Secondary | ICD-10-CM | POA: Diagnosis not present

## 2022-04-04 DIAGNOSIS — F200 Paranoid schizophrenia: Secondary | ICD-10-CM | POA: Diagnosis not present

## 2022-04-04 DIAGNOSIS — L603 Nail dystrophy: Secondary | ICD-10-CM | POA: Diagnosis not present

## 2022-04-04 DIAGNOSIS — I10 Essential (primary) hypertension: Secondary | ICD-10-CM | POA: Diagnosis not present

## 2022-04-04 DIAGNOSIS — M79674 Pain in right toe(s): Secondary | ICD-10-CM | POA: Diagnosis not present

## 2022-04-04 DIAGNOSIS — M19072 Primary osteoarthritis, left ankle and foot: Secondary | ICD-10-CM | POA: Diagnosis not present

## 2022-04-04 DIAGNOSIS — Z4789 Encounter for other orthopedic aftercare: Secondary | ICD-10-CM | POA: Diagnosis not present

## 2022-04-04 DIAGNOSIS — B351 Tinea unguium: Secondary | ICD-10-CM | POA: Diagnosis not present

## 2022-04-04 DIAGNOSIS — Z7982 Long term (current) use of aspirin: Secondary | ICD-10-CM | POA: Diagnosis not present

## 2022-04-04 DIAGNOSIS — Z13220 Encounter for screening for lipoid disorders: Secondary | ICD-10-CM | POA: Diagnosis not present

## 2022-04-04 DIAGNOSIS — F324 Major depressive disorder, single episode, in partial remission: Secondary | ICD-10-CM | POA: Diagnosis not present

## 2022-04-04 DIAGNOSIS — E559 Vitamin D deficiency, unspecified: Secondary | ICD-10-CM | POA: Diagnosis not present

## 2022-04-17 DIAGNOSIS — S82851A Displaced trimalleolar fracture of right lower leg, initial encounter for closed fracture: Secondary | ICD-10-CM | POA: Diagnosis not present

## 2022-04-19 DIAGNOSIS — R2681 Unsteadiness on feet: Secondary | ICD-10-CM | POA: Diagnosis not present

## 2022-04-19 DIAGNOSIS — S82851D Displaced trimalleolar fracture of right lower leg, subsequent encounter for closed fracture with routine healing: Secondary | ICD-10-CM | POA: Diagnosis not present

## 2022-04-19 DIAGNOSIS — M6259 Muscle wasting and atrophy, not elsewhere classified, multiple sites: Secondary | ICD-10-CM | POA: Diagnosis not present

## 2022-05-20 DIAGNOSIS — E785 Hyperlipidemia, unspecified: Secondary | ICD-10-CM | POA: Diagnosis not present

## 2022-06-03 DIAGNOSIS — I10 Essential (primary) hypertension: Secondary | ICD-10-CM | POA: Diagnosis not present

## 2022-06-03 DIAGNOSIS — J45909 Unspecified asthma, uncomplicated: Secondary | ICD-10-CM | POA: Diagnosis not present

## 2022-06-03 DIAGNOSIS — E039 Hypothyroidism, unspecified: Secondary | ICD-10-CM | POA: Diagnosis not present

## 2022-06-03 DIAGNOSIS — D519 Vitamin B12 deficiency anemia, unspecified: Secondary | ICD-10-CM | POA: Diagnosis not present

## 2022-06-10 DIAGNOSIS — D519 Vitamin B12 deficiency anemia, unspecified: Secondary | ICD-10-CM | POA: Diagnosis not present

## 2022-06-19 DIAGNOSIS — S82851A Displaced trimalleolar fracture of right lower leg, initial encounter for closed fracture: Secondary | ICD-10-CM | POA: Diagnosis not present

## 2022-07-04 DIAGNOSIS — E785 Hyperlipidemia, unspecified: Secondary | ICD-10-CM | POA: Diagnosis not present

## 2022-07-05 DIAGNOSIS — F819 Developmental disorder of scholastic skills, unspecified: Secondary | ICD-10-CM | POA: Diagnosis not present

## 2022-07-05 DIAGNOSIS — F209 Schizophrenia, unspecified: Secondary | ICD-10-CM | POA: Diagnosis not present

## 2022-07-05 DIAGNOSIS — R4189 Other symptoms and signs involving cognitive functions and awareness: Secondary | ICD-10-CM | POA: Diagnosis not present

## 2022-07-10 ENCOUNTER — Ambulatory Visit (INDEPENDENT_AMBULATORY_CARE_PROVIDER_SITE_OTHER): Payer: PPO | Admitting: Nurse Practitioner

## 2022-07-10 ENCOUNTER — Encounter: Payer: Self-pay | Admitting: Nurse Practitioner

## 2022-07-10 VITALS — BP 100/82 | HR 89 | Ht 73.0 in | Wt 252.0 lb

## 2022-07-10 DIAGNOSIS — R0683 Snoring: Secondary | ICD-10-CM | POA: Insufficient documentation

## 2022-07-10 DIAGNOSIS — G4719 Other hypersomnia: Secondary | ICD-10-CM | POA: Diagnosis not present

## 2022-07-10 DIAGNOSIS — E669 Obesity, unspecified: Secondary | ICD-10-CM | POA: Diagnosis not present

## 2022-07-10 NOTE — Assessment & Plan Note (Signed)
BMI 33. Discussed correlation between obesity and OSA. Encouraged healthy weight loss.

## 2022-07-10 NOTE — Progress Notes (Signed)
@Patient  ID: , male    DOB: 02/22/1966, 56 y.o.   MRN: [id number]  Chief Complaint  Patient presents with   Consult    Snoring    Referring provider: No ref. provider found  HPI: 56 year old male, former smoker referred for sleep consult. Past medical history significant for HTN, asthma, schizophrenia.   TEST/EVENTS:   07/10/2022: Today - sleep consult Patient presents today with his caregiver for sleep consult referred by his PCP , 09/09/2022, PCP. She helps to provide history. He has had a longstanding history of loud snoring. He lives at a group home and you can hear him outside the room. He also has been falling asleep throughout the day. He does feel like he sleeps well at night, but he wakes up 3-4 times a night to use the bathroom. They also notice that he sleep talks at times. He does not drive. He denies morning headaches, sleep paralysis, gasping, cataplexy.  He goes to bed around 9pm; falls asleep quickly. He wakes up in the AM around 630 AM. He does not take anything to help him fall asleep but he is on Depakote and Invega. He is on disability. Weight has been stable, from what he can recall.  History of asthma and HTN well controlled. He is a former smoker. Does not drink alcohol. No significant family history that he is aware of.  Epworth 7  Allergies  Allergen Reactions   Cefprozil    Cetirizine Hcl    Doxycycline      There is no immunization history on file for this patient.  Past Medical History:  Diagnosis Date   Acute bronchitis    Asthma    Depression    Hypertension    Schizophrenia (HCC)     Tobacco History: Social History   Tobacco Use  Smoking Status Former  Smokeless Tobacco Not on file   Counseling given: Not Answered   Outpatient Medications Prior to Visit  Medication Sig Dispense Refill   albuterol (VENTOLIN HFA) 108 (90 Base) MCG/ACT inhaler Inhale 2 puffs into the lungs every 4 (four) hours as needed for cough or  congestion.     fluticasone-salmeterol (ADVAIR) 500-50 MCG/ACT AEPB Inhale 1 puff into the lungs 2 (two) times daily.     aspirin EC 325 MG tablet Take 1 tablet (325 mg total) by mouth daily. 30 tablet 0   benztropine (COGENTIN) 1 MG tablet Take 1 mg by mouth 3 (three) times daily.     BREO ELLIPTA 200-25 MCG/ACT AEPB Inhale 1 puff into the lungs daily. (Patient not taking: Reported on 07/10/2022)     cholecalciferol (VITAMIN D) 25 MCG (1000 UNIT) tablet Take 2,000 Units by mouth daily.     divalproex (DEPAKOTE ER) 500 MG 24 hr tablet Take 2,000 mg by mouth at bedtime.     docusate sodium (COLACE) 100 MG capsule Take 1 capsule (100 mg total) by mouth 2 (two) times daily. 10 capsule 0   metoprolol succinate (TOPROL-XL) 100 MG 24 hr tablet Take 1 tablet (100 mg total) by mouth daily. Take with or immediately following a meal. 30 tablet 0   Multiple Vitamins-Minerals (MULTIVITAMIN WITH MINERALS) tablet Take 1 tablet by mouth daily.     omeprazole (PRILOSEC) 20 MG capsule Take 20 mg by mouth daily.     oxyCODONE (OXY IR/ROXICODONE) 5 MG immediate release tablet Take 1-2 tablets (5-10 mg total) by mouth every 4 (four) hours as needed for breakthrough pain ((for  MODERATE breakthrough pain)). 30 tablet 0   paliperidone (INVEGA) 6 MG 24 hr tablet Take 6 mg by mouth at bedtime.     No facility-administered medications prior to visit.     Review of Systems:   Constitutional: No weight loss or gain, night sweats, fevers, chills, or lassitude. +excessive daytime sleepiness HEENT: No headaches, difficulty swallowing, tooth/dental problems, or sore throat. No sneezing, itching, ear ache, nasal congestion, or post nasal drip CV:  No chest pain, orthopnea, PND, swelling in lower extremities, anasarca, dizziness, palpitations, syncope Resp: +snoring. No shortness of breath with exertion or at rest. No excess mucus or change in color of mucus. No productive or non-productive. No hemoptysis. No wheezing.  No chest  wall deformity GI:  No heartburn, indigestion, abdominal pain, nausea, vomiting, diarrhea, change in bowel habits, loss of appetite, bloody stools.  GU: +nocturia. No dysuria, change in color of urine, urgency or frequency.  No flank pain, no hematuria  MSK:  +chronic joint pain. No joint swelling.  No decreased range of motion.  No back pain. Neuro: No dizziness or lightheadedness.  Psych: No depression or anxiety. Mood stable.     Physical Exam:  BP 100/82 (BP Location: Right Arm)   Pulse 89   Ht 6\' 1"  (1.854 m)   Wt 252 lb (114.3 kg)   SpO2 92%   BMI 33.25 kg/m   GEN: Pleasant, interactive, well-appearing; obese; in no acute distress. HEENT:  Normocephalic and atraumatic. PERRLA. Sclera white. Nasal turbinates pink, moist and patent bilaterally. No rhinorrhea present. Oropharynx pink and moist, without exudate or edema. No lesions, ulcerations, or postnasal drip. Mallampati III NECK:  Supple w/ fair ROM. No JVD present. Normal carotid impulses w/o bruits. Thyroid symmetrical with no goiter or nodules palpated. No lymphadenopathy.   CV: RRR, no m/r/g, no peripheral edema. Pulses intact, +2 bilaterally. No cyanosis, pallor or clubbing. PULMONARY:  Unlabored, regular breathing. Clear bilaterally A&P w/o wheezes/rales/rhonchi. No accessory muscle use. No dullness to percussion. GI: BS present and normoactive. Soft, non-tender to palpation.  MSK: No erythema, warmth or tenderness.  Neuro: A/Ox3. No focal deficits noted.   Skin: Warm, no lesions or rashe Psych: Normal affect and behavior. Judgement and thought content appropriate.     Lab Results:  CBC    Component Value Date/Time   WBC 8.5 01/04/2022 2009   RBC 4.17 (L) 01/04/2022 2009   HGB 13.2 01/04/2022 2009   HCT 41.1 01/04/2022 2009   PLT 190 01/04/2022 2009   MCV 98.6 01/04/2022 2009   MCH 31.7 01/04/2022 2009   MCHC 32.1 01/04/2022 2009   RDW 13.6 01/04/2022 2009   LYMPHSABS 0.6 (L) 12/06/2021 1655   MONOABS 0.7  12/06/2021 1655   EOSABS 0.0 12/06/2021 1655   BASOSABS 0.0 12/06/2021 1655    BMET    Component Value Date/Time   NA 141 01/04/2022 2009   K 4.1 01/04/2022 2009   CL 104 01/04/2022 2009   CO2 30 01/04/2022 2009   GLUCOSE 108 (H) 01/04/2022 2009   BUN 18 01/04/2022 2009   CREATININE 1.15 01/04/2022 2009   CALCIUM 8.8 (L) 01/04/2022 2009   GFRNONAA >60 01/04/2022 2009   GFRAA >60 04/24/2018 1749    BNP No results found for: "BNP"   Imaging:  No results found.        No data to display          No results found for: "NITRICOXIDE"      Assessment & Plan:  Snoring He has snoring, excessive daytime sleepiness, nocturia. BMI 33. Epworth 7. Given this,  I am concerned he could have sleep disordered breathing with obstructive sleep apnea. He will need sleep study for further evaluation; preferred in lab study.    - discussed how weight can impact sleep and risk for sleep disordered breathing - discussed options to assist with weight loss: combination of diet modification, cardiovascular and strength training exercises   - had an extensive discussion regarding the adverse health consequences related to untreated sleep disordered breathing - specifically discussed the risks for hypertension, coronary artery disease, cardiac dysrhythmias, cerebrovascular disease, and diabetes - lifestyle modification discussed   - discussed how sleep disruption can increase risk of accidents, particularly when driving - safe driving practices were discussed  Patient Instructions  Given your symptoms, I am concerned that you may have sleep disordered breathing with sleep apnea. I have ordered a sleep study to be completed at the [facility]. Someone will contact you for scheduling.  We discussed how untreated sleep apnea puts an individual at risk for cardiac arrhthymias, pulm HTN, DM, stroke and increases their risk for daytime accidents. We also briefly reviewed  treatment options including weight loss, side sleeping position, oral appliance, CPAP therapy or referral to ENT for possible surgical options  Follow up in 6-8 weeks with Katie Hendricks Schwandt,NP to discuss results. If symptoms do not improve or worsen, please contact office for sooner follow up or seek emergency care.     Obesity (BMI 30-39.9) BMI 33. Discussed correlation between obesity and OSA. Encouraged healthy weight loss.    I spent 35 minutes of dedicated to the care of this patient on the date of this encounter to include pre-visit review of records, face-to-face time with the patient discussing conditions above, post visit ordering of testing, clinical documentation with the electronic health record, making appropriate referrals as documented, and communicating necessary findings to members of the patients care team.  Noemi Chapel, NP 07/10/2022  Pt aware and understands NP's role.

## 2022-07-10 NOTE — Progress Notes (Signed)
Reviewed and agree with assessment/plan.   Coralyn Helling, MD [facility] 07/10/2022, 2:05 PM Pager:  [phone]

## 2022-07-10 NOTE — Assessment & Plan Note (Signed)
He has snoring, excessive daytime sleepiness, nocturia. BMI 33. Epworth 7. Given this,  I am concerned he could have sleep disordered breathing with obstructive sleep apnea. He will need sleep study for further evaluation; preferred in lab study.    - discussed how weight can impact sleep and risk for sleep disordered breathing - discussed options to assist with weight loss: combination of diet modification, cardiovascular and strength training exercises   - had an extensive discussion regarding the adverse health consequences related to untreated sleep disordered breathing - specifically discussed the risks for hypertension, coronary artery disease, cardiac dysrhythmias, cerebrovascular disease, and diabetes - lifestyle modification discussed   - discussed how sleep disruption can increase risk of accidents, particularly when driving - safe driving practices were discussed  Patient Instructions  Given your symptoms, I am concerned that you may have sleep disordered breathing with sleep apnea. I have ordered a sleep study to be completed at the [facility]. Someone will contact you for scheduling.  We discussed how untreated sleep apnea puts an individual at risk for cardiac arrhthymias, pulm HTN, DM, stroke and increases their risk for daytime accidents. We also briefly reviewed treatment options including weight loss, side sleeping position, oral appliance, CPAP therapy or referral to ENT for possible surgical options  Follow up in 6-8 weeks with Katie Corlene Sabia,NP to discuss results. If symptoms do not improve or worsen, please contact office for sooner follow up or seek emergency care.

## 2022-07-10 NOTE — Patient Instructions (Signed)
Given your symptoms, I am concerned that you may have sleep disordered breathing with sleep apnea. I have ordered a sleep study to be completed at the [facility]. Someone will contact you for scheduling.  We discussed how untreated sleep apnea puts an individual at risk for cardiac arrhthymias, pulm HTN, DM, stroke and increases their risk for daytime accidents. We also briefly reviewed treatment options including weight loss, side sleeping position, oral appliance, CPAP therapy or referral to ENT for possible surgical options  Follow up in 6-8 weeks with [name] Amayiah Gosnell,NP to discuss results. If symptoms do not improve or worsen, please contact office for sooner follow up or seek emergency care.

## 2022-07-26 DIAGNOSIS — Z03818 Encounter for observation for suspected exposure to other biological agents ruled out: Secondary | ICD-10-CM | POA: Diagnosis not present

## 2022-08-01 DIAGNOSIS — R634 Abnormal weight loss: Secondary | ICD-10-CM | POA: Diagnosis not present

## 2022-08-13 DIAGNOSIS — Z03818 Encounter for observation for suspected exposure to other biological agents ruled out: Secondary | ICD-10-CM | POA: Diagnosis not present

## 2022-08-21 DIAGNOSIS — S82851A Displaced trimalleolar fracture of right lower leg, initial encounter for closed fracture: Secondary | ICD-10-CM | POA: Diagnosis not present

## 2022-09-05 DIAGNOSIS — S82851A Displaced trimalleolar fracture of right lower leg, initial encounter for closed fracture: Secondary | ICD-10-CM | POA: Diagnosis not present

## 2022-09-05 DIAGNOSIS — G2119 Other drug induced secondary parkinsonism: Secondary | ICD-10-CM | POA: Diagnosis not present

## 2022-09-05 DIAGNOSIS — R2681 Unsteadiness on feet: Secondary | ICD-10-CM | POA: Diagnosis not present

## 2022-09-05 DIAGNOSIS — G219 Secondary parkinsonism, unspecified: Secondary | ICD-10-CM | POA: Diagnosis not present

## 2022-09-06 ENCOUNTER — Telehealth: Payer: Self-pay | Admitting: Nurse Practitioner

## 2022-09-06 NOTE — Telephone Encounter (Signed)
Hey ladies,   Can I get an estimation on split night sleep studies?  It was ordered back in 08/03/2023 and home health is wanting to know when it will be scheduled?  Please advise

## 2022-09-09 DIAGNOSIS — F209 Schizophrenia, unspecified: Secondary | ICD-10-CM | POA: Diagnosis not present

## 2022-09-09 DIAGNOSIS — F319 Bipolar disorder, unspecified: Secondary | ICD-10-CM | POA: Diagnosis not present

## 2022-09-11 DIAGNOSIS — E785 Hyperlipidemia, unspecified: Secondary | ICD-10-CM | POA: Diagnosis not present

## 2022-09-11 DIAGNOSIS — D519 Vitamin B12 deficiency anemia, unspecified: Secondary | ICD-10-CM | POA: Diagnosis not present

## 2022-09-11 DIAGNOSIS — S82851D Displaced trimalleolar fracture of right lower leg, subsequent encounter for closed fracture with routine healing: Secondary | ICD-10-CM | POA: Diagnosis not present

## 2022-09-11 DIAGNOSIS — G2119 Other drug induced secondary parkinsonism: Secondary | ICD-10-CM | POA: Diagnosis not present

## 2022-09-11 DIAGNOSIS — E559 Vitamin D deficiency, unspecified: Secondary | ICD-10-CM | POA: Diagnosis not present

## 2022-09-11 DIAGNOSIS — I10 Essential (primary) hypertension: Secondary | ICD-10-CM | POA: Diagnosis not present

## 2022-09-11 DIAGNOSIS — F209 Schizophrenia, unspecified: Secondary | ICD-10-CM | POA: Diagnosis not present

## 2022-09-11 DIAGNOSIS — J4540 Moderate persistent asthma, uncomplicated: Secondary | ICD-10-CM | POA: Diagnosis not present

## 2022-09-11 DIAGNOSIS — F99 Mental disorder, not otherwise specified: Secondary | ICD-10-CM | POA: Diagnosis not present

## 2022-09-11 DIAGNOSIS — F319 Bipolar disorder, unspecified: Secondary | ICD-10-CM | POA: Diagnosis not present

## 2022-09-15 IMAGING — CT CT CERVICAL SPINE W/O CM
3 of 4 series · 13 of 34 positions shown, 16 images · non-contrast
Comparison: None.

CLINICAL DATA: Fell out of bed, hit head



[Series 4: sagittal bone · sagittal · 0.32mm/px · 5 of 82 slices shown, 6 images]
[im 28/82  bone]
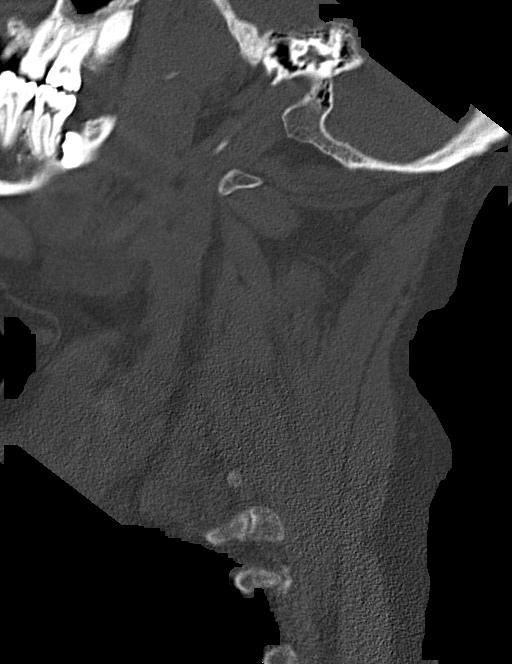
[im 34/82  bone]
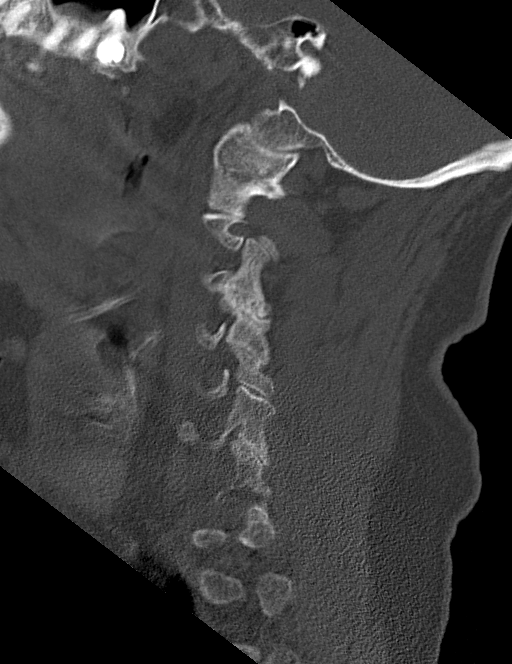
[im 41/82  soft-tissue]
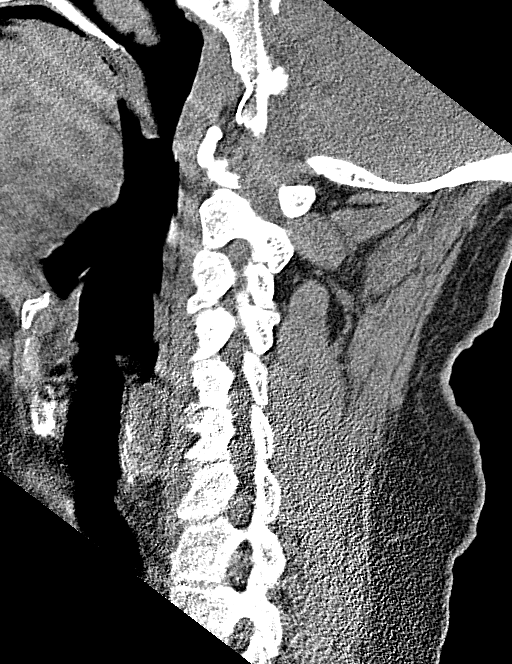
[im 41/82  bone]
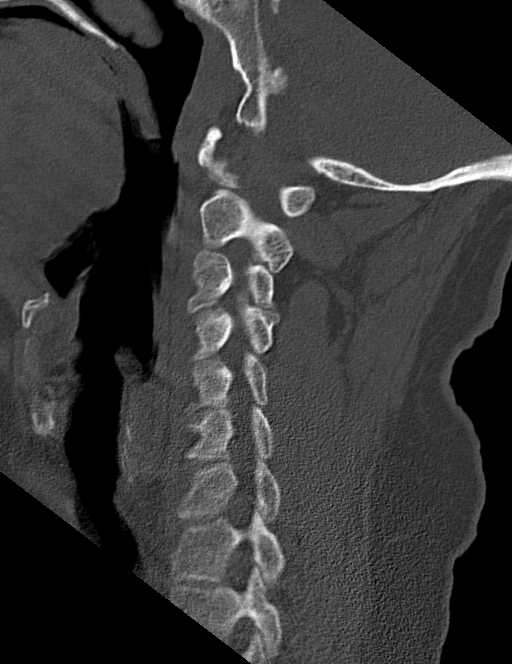
[im 48/82  bone]
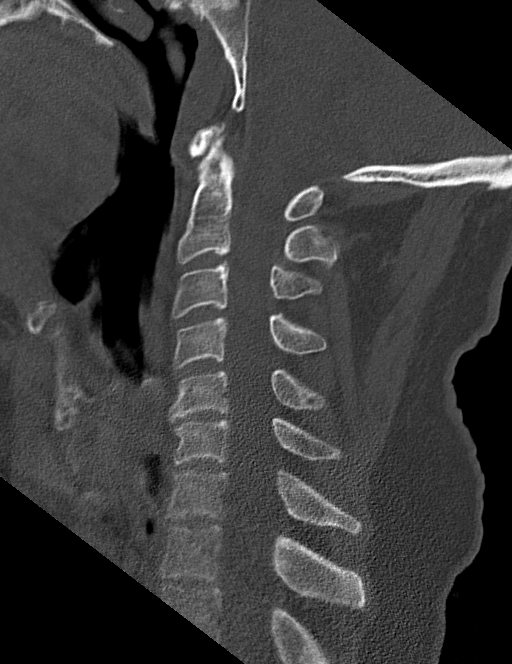
[im 55/82  bone]
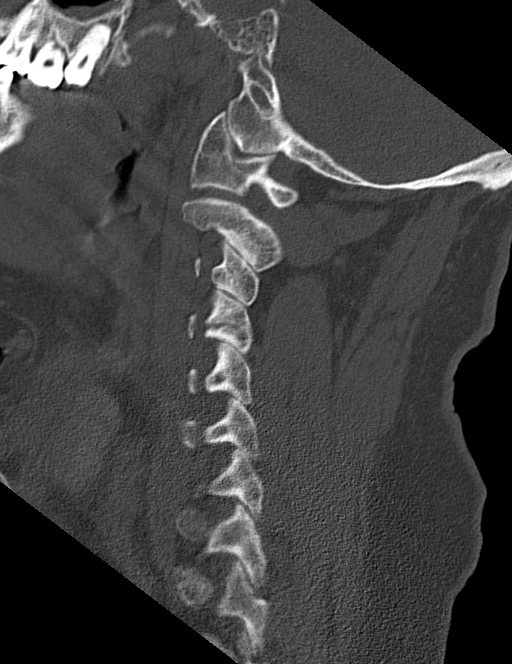

[Series 5: coronal bone · coronal · 0.26mm/px · 3 of 60 slices shown]
[im 17/60  bone]
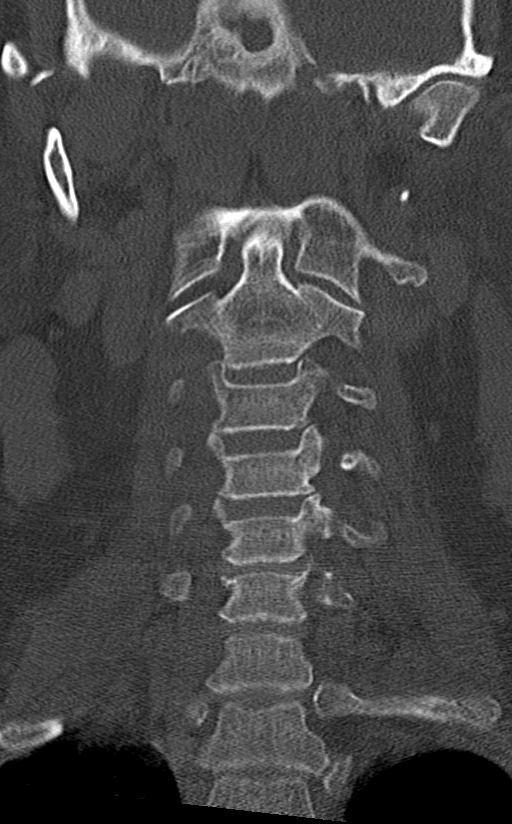
[im 26/60  bone]
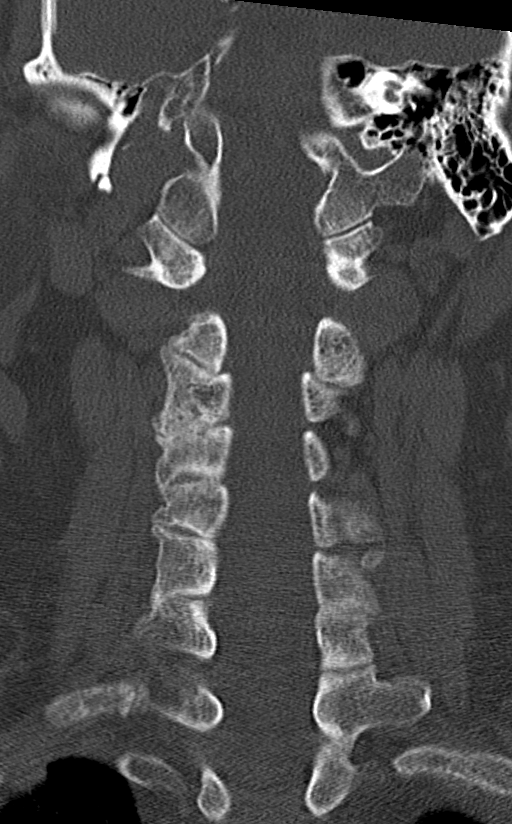
[im 35/60  bone]
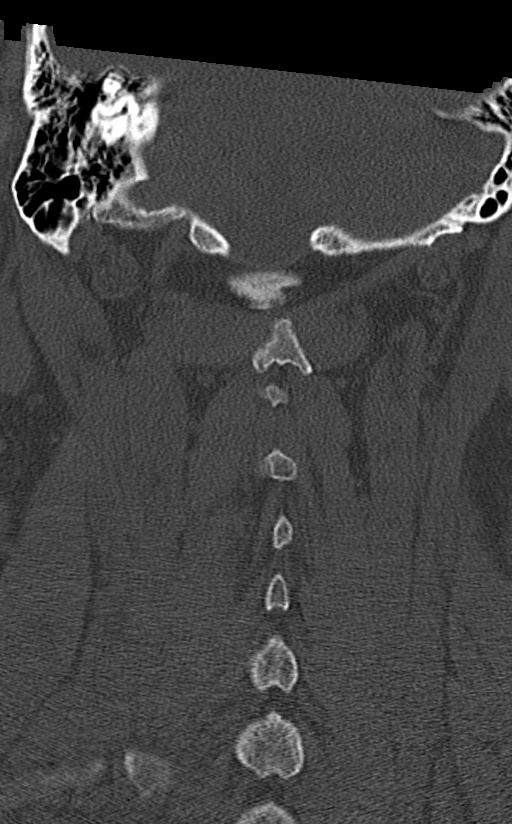

[Series 6: orthogonal bone · axial · 0.30mm/px · z∈[+201,+313]mm · 5 of 105 slices shown, 7 images]
[im 18/105  soft-tissue]
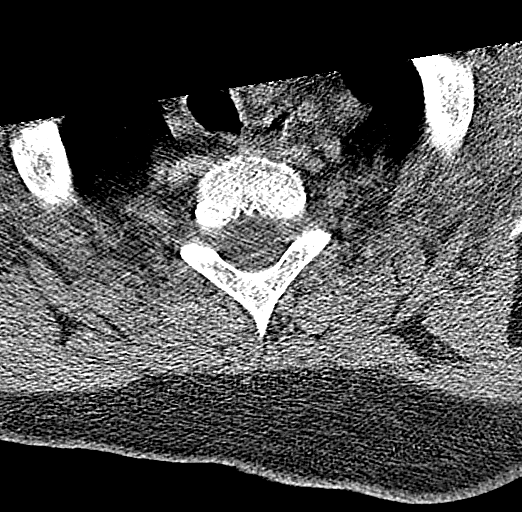
[im 18/105  bone]
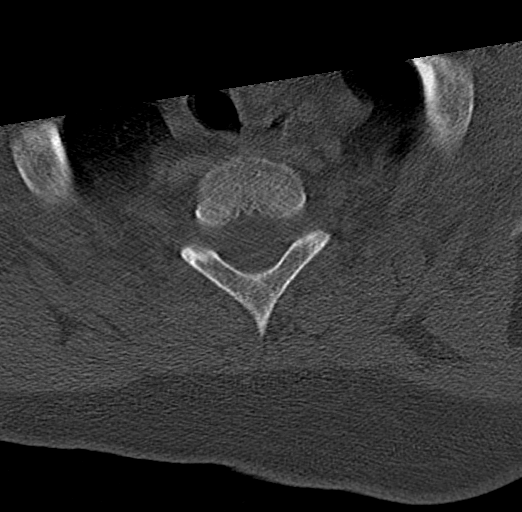
[im 35/105  bone]
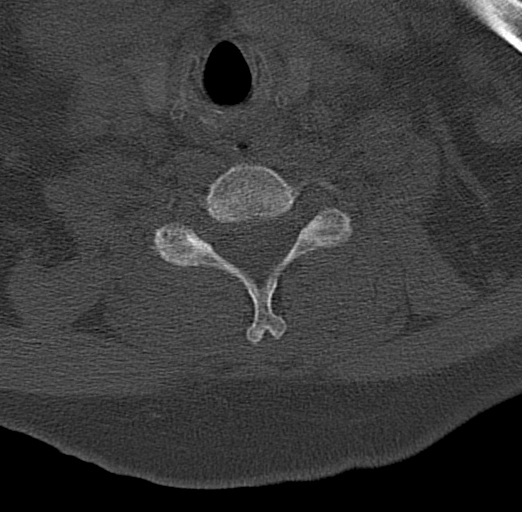
[im 53/105  bone]
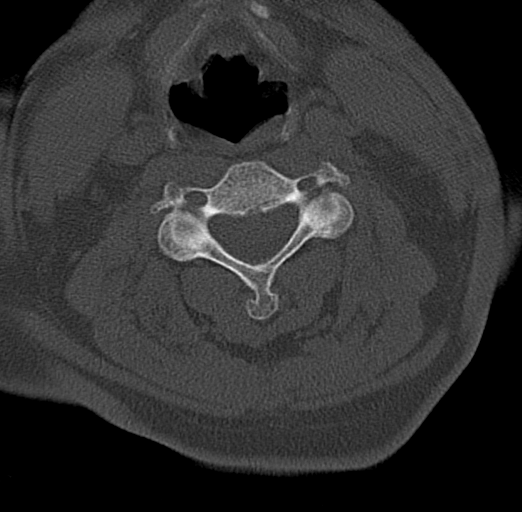
[im 70/105  bone]
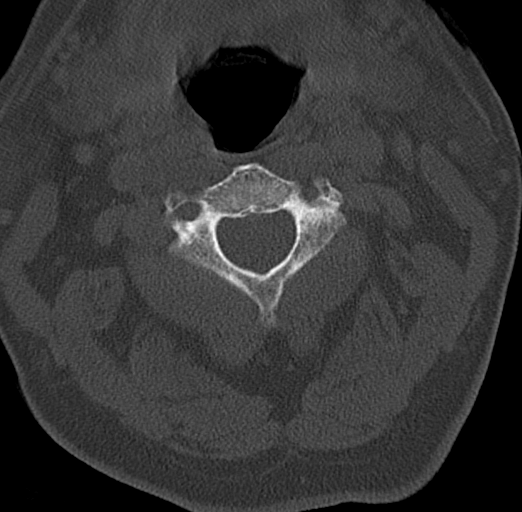
[im 87/105  soft-tissue]
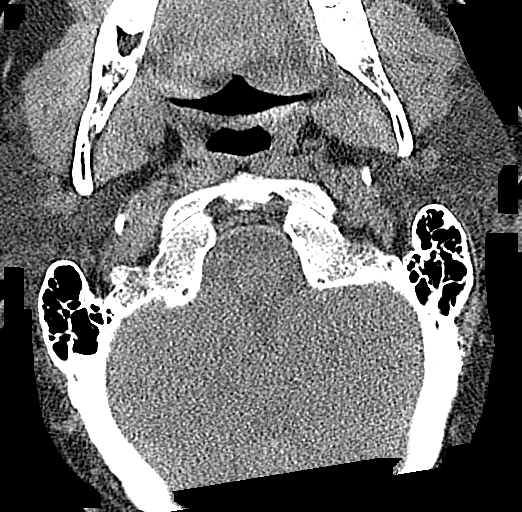
[im 87/105  bone]
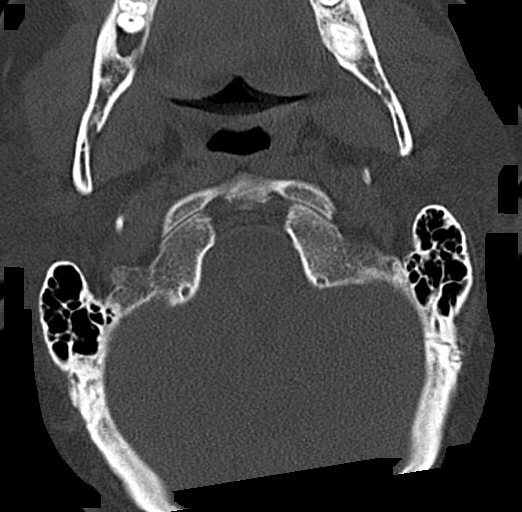

[13 of 34 positions shown; findings below may reference images not displayed]

FINDINGS: Alignment: Alignment is anatomic.

Skull base and vertebrae: No acute fracture. No primary bone lesion
or focal pathologic process.

Soft tissues and spinal canal: No prevertebral fluid or swelling. No
visible canal hematoma.

Disc levels: Mild cervical spondylosis greatest at the C5-6 level.
Prominent facet hypertrophy at C2-3 and C3-4.

Upper chest: Airway is patent. Visualized portions of the lung
apices are clear.

Other: Reconstructed images demonstrate no additional findings.
IMPRESSION: 1. No acute cervical spine fracture.

## 2022-09-15 IMAGING — CT CT HEAD W/O CM
4 series · 16 of 47 positions shown, 18 images · non-contrast
Comparison: None.

CLINICAL DATA: Fell out of bed, hit posterior head



[Series 2: head wo · axial · 0.45mm/px · z∈[+390,+505]mm · 7 of 31 slices shown, 9 images]
[im 4/31  brain]
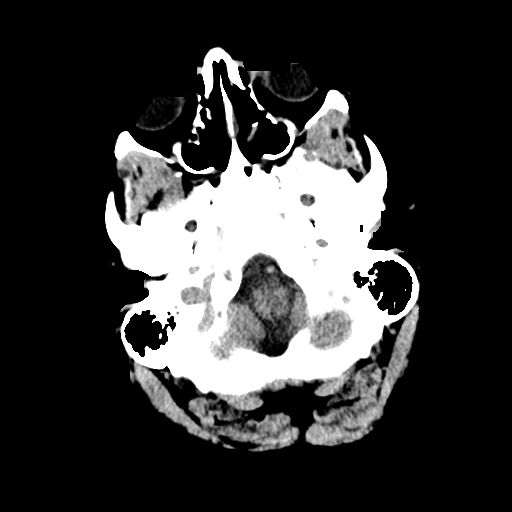
[im 4/31  bone]
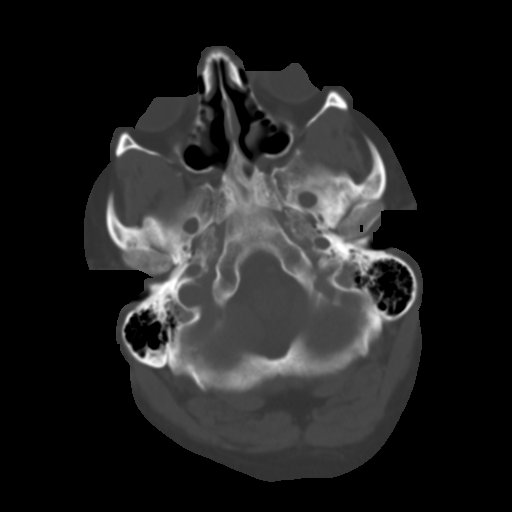
[im 8/31  brain]
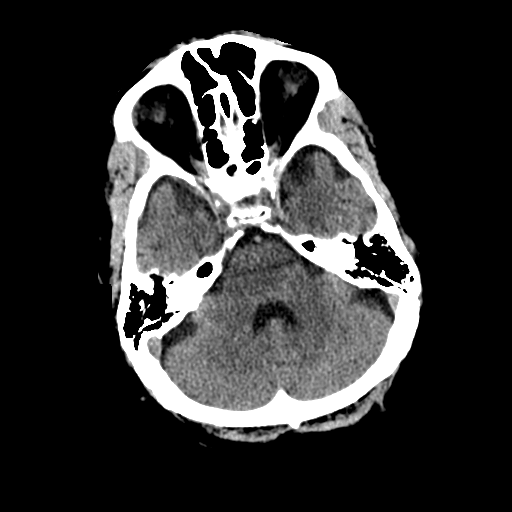
[im 12/31  brain]
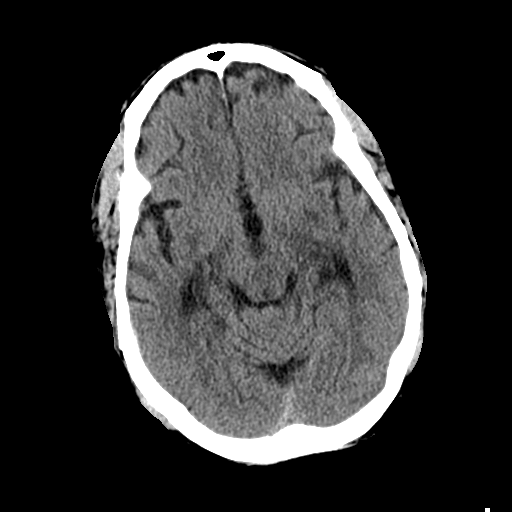
[im 16/31  brain]
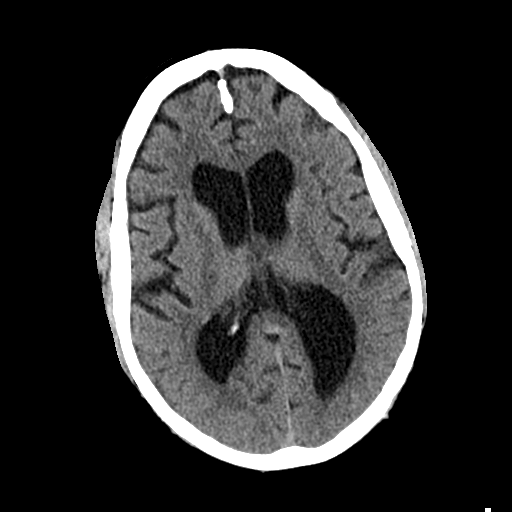
[im 19/31  brain]
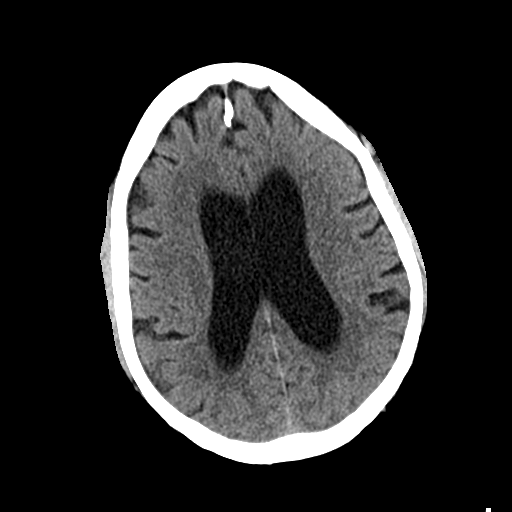
[im 19/31  bone]
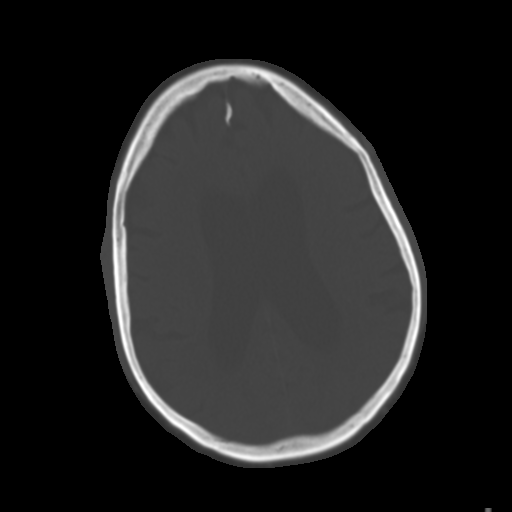
[im 23/31  brain]
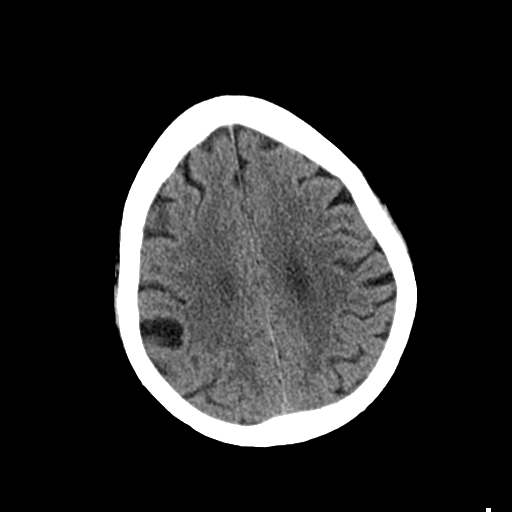
[im 27/31  brain]
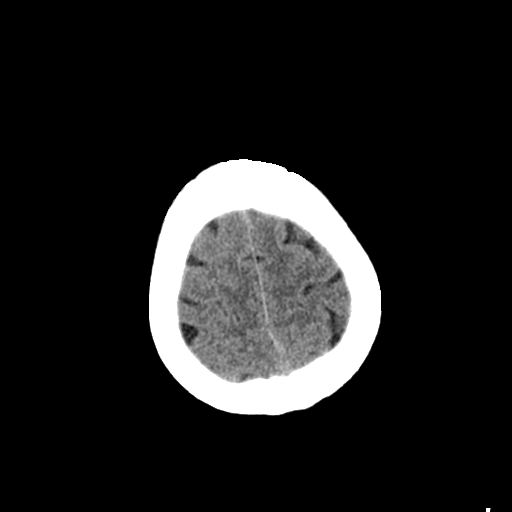

[Series 3: head bone · axial · 0.45mm/px · z∈[+389,+421]mm · 3 of 78 slices shown]
[im 8/78  bone]
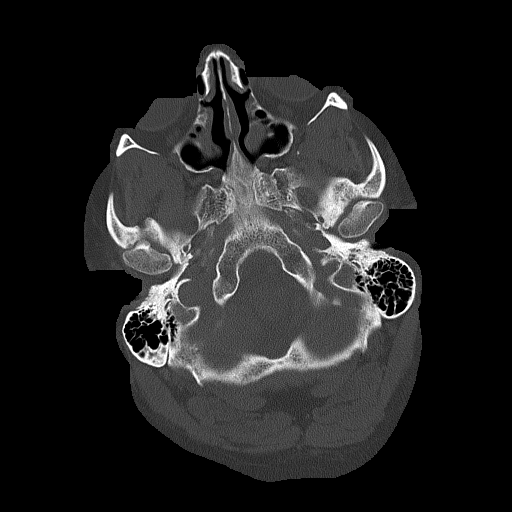
[im 16/78  bone]
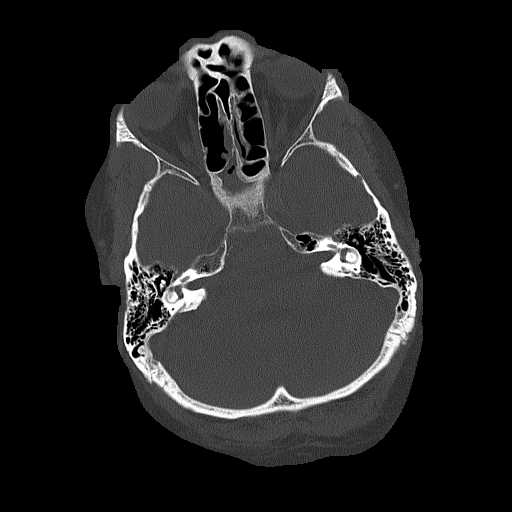
[im 24/78  bone]
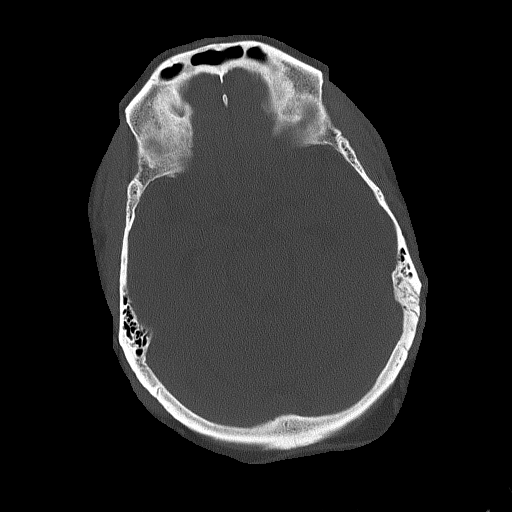

[Series 4: coronal soft tissue · coronal · 0.32mm/px · 3 of 72 slices shown]
[im 24/72  brain]
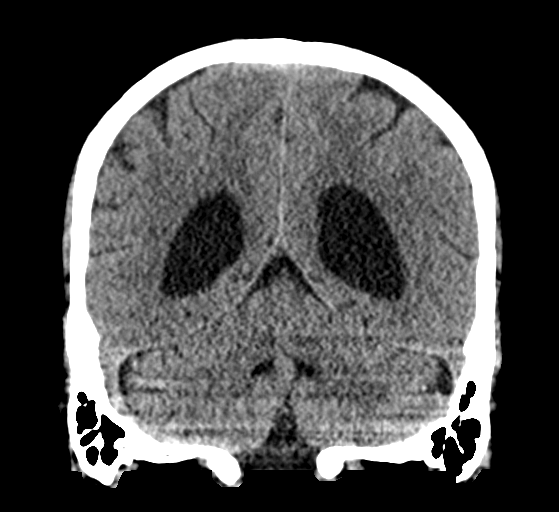
[im 32/72  brain]
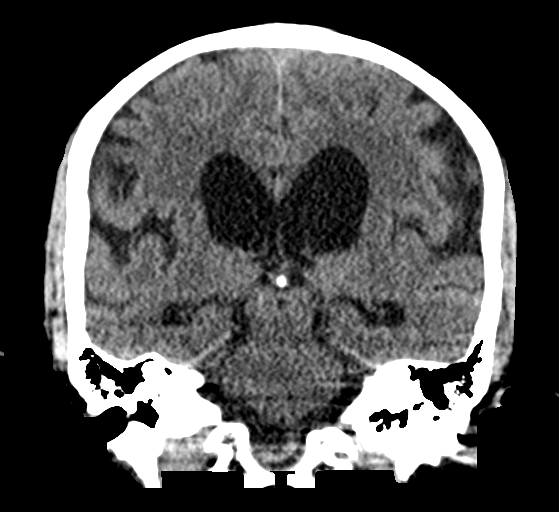
[im 40/72  brain]
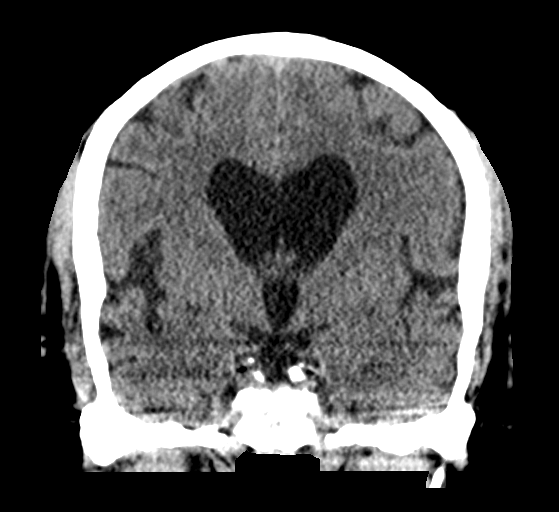

[Series 5: sagittal soft tissue · sagittal · 0.32mm/px · 3 of 57 slices shown]
[im 19/57  brain]
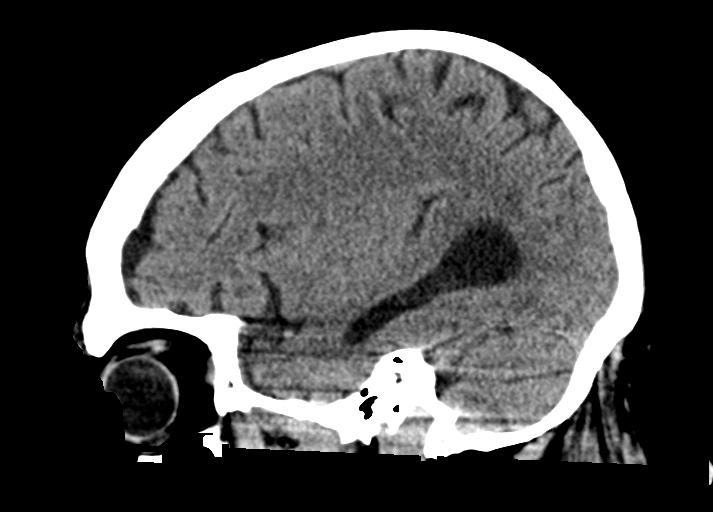
[im 29/57  brain]
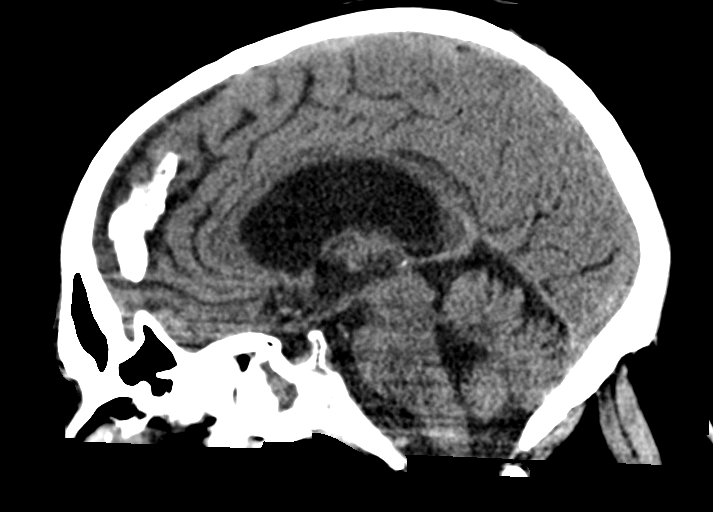
[im 38/57  brain]
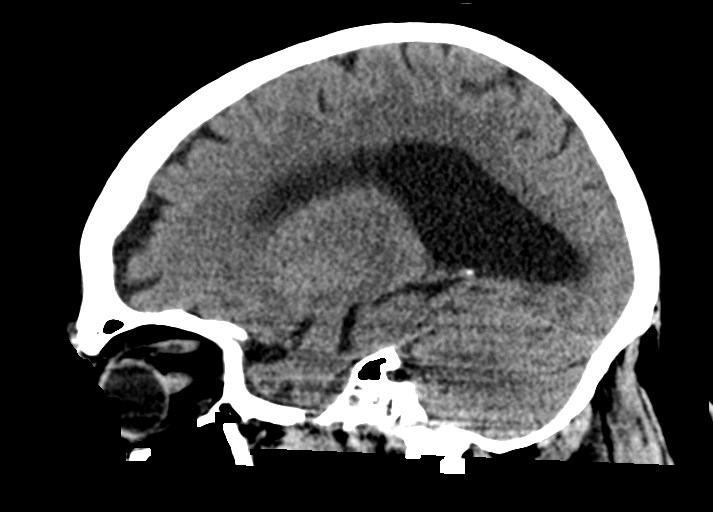

[16 of 47 positions shown; findings below may reference images not displayed]

FINDINGS: Brain: No acute infarct or hemorrhage. Lateral ventricles and
midline structures are unremarkable. No acute extra-axial fluid
collections. No mass effect.

Vascular: No hyperdense vessel or unexpected calcification.

Skull: Normal. Negative for fracture or focal lesion.

Sinuses/Orbits: Postsurgical changes from bilateral ethmoidectomies.
Near complete opacification of the sphenoid sinus.

Other: None.
IMPRESSION: 1. No acute intracranial process.

## 2022-09-16 DIAGNOSIS — E785 Hyperlipidemia, unspecified: Secondary | ICD-10-CM | POA: Diagnosis not present

## 2022-09-16 DIAGNOSIS — I10 Essential (primary) hypertension: Secondary | ICD-10-CM | POA: Diagnosis not present

## 2022-09-16 DIAGNOSIS — F319 Bipolar disorder, unspecified: Secondary | ICD-10-CM | POA: Diagnosis not present

## 2022-09-16 DIAGNOSIS — E559 Vitamin D deficiency, unspecified: Secondary | ICD-10-CM | POA: Diagnosis not present

## 2022-10-04 DIAGNOSIS — I10 Essential (primary) hypertension: Secondary | ICD-10-CM | POA: Diagnosis not present

## 2022-10-04 DIAGNOSIS — S82851D Displaced trimalleolar fracture of right lower leg, subsequent encounter for closed fracture with routine healing: Secondary | ICD-10-CM | POA: Diagnosis not present

## 2022-10-04 DIAGNOSIS — D519 Vitamin B12 deficiency anemia, unspecified: Secondary | ICD-10-CM | POA: Diagnosis not present

## 2022-10-04 DIAGNOSIS — E559 Vitamin D deficiency, unspecified: Secondary | ICD-10-CM | POA: Diagnosis not present

## 2022-10-04 DIAGNOSIS — F209 Schizophrenia, unspecified: Secondary | ICD-10-CM | POA: Diagnosis not present

## 2022-10-04 DIAGNOSIS — F99 Mental disorder, not otherwise specified: Secondary | ICD-10-CM | POA: Diagnosis not present

## 2022-10-04 DIAGNOSIS — E785 Hyperlipidemia, unspecified: Secondary | ICD-10-CM | POA: Diagnosis not present

## 2022-10-04 DIAGNOSIS — G2119 Other drug induced secondary parkinsonism: Secondary | ICD-10-CM | POA: Diagnosis not present

## 2022-10-04 DIAGNOSIS — F319 Bipolar disorder, unspecified: Secondary | ICD-10-CM | POA: Diagnosis not present

## 2022-10-04 DIAGNOSIS — J4540 Moderate persistent asthma, uncomplicated: Secondary | ICD-10-CM | POA: Diagnosis not present

## 2022-10-06 ENCOUNTER — Ambulatory Visit (HOSPITAL_BASED_OUTPATIENT_CLINIC_OR_DEPARTMENT_OTHER): Payer: PPO | Attending: Nurse Practitioner | Admitting: Pulmonary Disease

## 2022-10-06 DIAGNOSIS — G4733 Obstructive sleep apnea (adult) (pediatric): Secondary | ICD-10-CM | POA: Diagnosis not present

## 2022-10-06 DIAGNOSIS — R0683 Snoring: Secondary | ICD-10-CM | POA: Diagnosis not present

## 2022-10-16 DIAGNOSIS — M6281 Muscle weakness (generalized): Secondary | ICD-10-CM | POA: Diagnosis not present

## 2022-10-16 DIAGNOSIS — R279 Unspecified lack of coordination: Secondary | ICD-10-CM | POA: Diagnosis not present

## 2022-10-16 DIAGNOSIS — Z9181 History of falling: Secondary | ICD-10-CM | POA: Diagnosis not present

## 2022-10-22 DIAGNOSIS — Z9181 History of falling: Secondary | ICD-10-CM | POA: Diagnosis not present

## 2022-10-22 DIAGNOSIS — F819 Developmental disorder of scholastic skills, unspecified: Secondary | ICD-10-CM | POA: Diagnosis not present

## 2022-10-22 DIAGNOSIS — M6281 Muscle weakness (generalized): Secondary | ICD-10-CM | POA: Diagnosis not present

## 2022-10-22 DIAGNOSIS — R279 Unspecified lack of coordination: Secondary | ICD-10-CM | POA: Diagnosis not present

## 2022-10-22 DIAGNOSIS — F209 Schizophrenia, unspecified: Secondary | ICD-10-CM | POA: Diagnosis not present

## 2022-10-22 DIAGNOSIS — R4189 Other symptoms and signs involving cognitive functions and awareness: Secondary | ICD-10-CM | POA: Diagnosis not present

## 2022-10-22 DIAGNOSIS — E663 Overweight: Secondary | ICD-10-CM | POA: Diagnosis not present

## 2022-10-23 ENCOUNTER — Telehealth: Payer: Self-pay | Admitting: Pulmonary Disease

## 2022-10-23 DIAGNOSIS — R279 Unspecified lack of coordination: Secondary | ICD-10-CM | POA: Diagnosis not present

## 2022-10-23 DIAGNOSIS — Z9181 History of falling: Secondary | ICD-10-CM | POA: Diagnosis not present

## 2022-10-23 DIAGNOSIS — M6281 Muscle weakness (generalized): Secondary | ICD-10-CM | POA: Diagnosis not present

## 2022-10-23 NOTE — Telephone Encounter (Signed)
Call patient  Sleep study result  Date of study: 10/06/2022  Impression: Severe obstructive sleep apnea  Recommendation: DME referral  Trial of CPAP therapy of 16 cm H2O, C-Flex 2 with a Medium size Fisher&Paykel Full Face Simplus mask and heated humidification.  Encourage weight loss measures  Follow-up in the office 4 to 6 weeks following initiation of treatment

## 2022-10-23 NOTE — Procedures (Signed)
POLYSOMNOGRAPHY  Last, First: [name] [name] MRN: [id number] Gender: Male Age (years): 56 Weight (lbs): 240 DOB: [birth year] BMI: 32 Primary Care: No PCP Epworth Score: 8 Referring: Noemi Chapel NP Technician: Rolene Arbour Interpreting: Tomma Lightning MD Study Type: Split Night CPAP Ordered Study Type: Split Night CPAP Study date: 10/06/2022 Location: [place] CLINICAL INFORMATION [name] [name] is a 56 year old Male and was referred to the sleep center for evaluation of N/A. Indications include N/A.  MEDICATIONS Patient self administered medications include: ATORVASTATIN, BENZTROPINE MESYLATE, divalproex er. Medications administered during study include No sleep medicine administered.  SLEEP STUDY TECHNIQUE The patient underwent an attended overnight level one polysomnography titration to assess the effects of CPAP therapy. The following variables were monitored: EEG (C4-A1, C3-A2, O1-A2, O2-A1), EOG, submental and leg EMG, ECG, oxyhemoglobin saturation by pulse oximetry, thoracic and abdominal respiratory effort belts, nasal/oral airflow by pressure sensor, body position sensor and snoring sensor. CPAP pressure was titrated to eliminate apneas, hypopneas and oxygen desaturation. Hypopneas were scored per AASM definition IB (4% desaturation)  The NPSG portion of the study ended at 11:56:48 PM . The CPAP titration was initiated at 12:07:17 AM AM with the CPAP portion of the study ending at 4:59:13 AM.  TECHNICIAN COMMENTS Comments added by Technician: none Comments added by Scorer: N/A SLEEP ARCHITECTURE The recording time for the entire night was 441.1 minutes. The diagnostic portion was initiated at 9:38:09 PM and terminated at 11:56:48 PM. The time in bed was 138.6 minutes. EEG confirmed total sleep time was 134.9 minutes yielding a sleep efficiency of 97.3%. Sleep onset after lights out was 0.0 minutes with a REM latency of 72.4 minutes. The patient spent 0.7% of  the night in stage N1 sleep, 58.6% in stage N2 sleep, 5.2% in stage N3 and 35.6% in REM. The Arousal Index was 32.5/hour.  The titration portion was initiated at 12:07:17 AM and terminated at 4:59:13 AM. The time in bed was 291.9 minutes. EEG confirmed total sleep time was 286.7 minutes yielding a sleep efficiency of 98.2%. Sleep onset after CPAP initiation was 4.8 minutes with a REM latency of 110.0 minutes. The patient spent 0.0% of the night in stage N1 sleep, 42.4% in stage N2 sleep, 3.3% in stage N3 and 54.2% in REM. The Arousal Index was 4.8/hour. RESPIRATORY PARAMETERS During the diagnostic portion, there were a total of 125 respiratory disturbances recorded; 49 apneas ( 41 obstructive, 0 mixed, 8 central), 67 hypopneas and 9 RERAs. The apnea/hypopnea index 51.6 was events/hour and the RDI was 55.6 events/hour. The central sleep apnea index was 3.6 events/hour. The REM AHI was 53.8 /h and NREM AHI was 51.8/h. The REM RDI was 53.8 /h and NREM RDI was 58.0 /h. The supine AHI was 51.6/h, and the non supine AHI was 0/h; supine during 99.9% of sleep. The supine RDI was 55.6/h, and the non supine RDI was N/A/h. Respiratory disturbances were associated with oxygen desaturation down to a nadir of 70.0 % during sleep. The mean oxygen saturation during the study was 91.0%. The cumulative time under 88% oxygen saturation was 29.1 minutes.  During the titration portion, the apnea/hypopnea index (AHI) was 14.9 events/hour and the RDI was 15.9 events/hour. The central sleep apnea index was events/hour. The most appropriate setting of CPAP was IPAP/EPAP 16/16 cm H2O. At this setting, the sleep efficiency was 100% and the patient was supine for 100%. The AHI was 1.6 events per hour(with 3 central events). Oxygen nadir was 90.0. LEG MOVEMENT DATA The  periodic limb movement index was 0.0/hour with an associated arousal index of /hour. CARDIAC DATA The underlying cardiac rhythm was most consistent with sinus rhythm.  Mean heart rate was 73.8 during diagnostic portion and 66.9 during titration portion of study. Additional rhythm abnormalities include None.  IMPRESSIONS - Severe Obstructive Sleep apnea(OSA) Optimal pressure attained. - EKG showed no cardiac abnormalities. - No Significant Central Sleep Apnea (CSA) - Severe Oxygen Desaturation - The patient snored with loud snoring volume. - EEG did not show alpha intrusion. - No significant periodic leg movements(PLMs) during sleep. However, no significant associated arousals. - Normal sleep efficiency, short primary sleep latency, short REM sleep latency and short slow wave latency.  DIAGNOSIS - Severe Obstructive Sleep Apnea (G47.33), AHI 51.6  RECOMMENDATIONS - Trial of CPAP therapy of 16 cm H2O, C-Flex 2 with a Medium size Fisher&Paykel Full Face Simplus mask and heated humidification. - Avoid alcohol, sedatives and other CNS depressants that may worsen sleep apnea and disrupt normal sleep architecture. - Sleep hygiene should be reviewed to assess factors that may improve sleep quality. - Weight management and regular exercise should be initiated or continued. - Return to Sleep Center for re-evaluation after 4 weeks of therapy  [Electronically signed] 10/23/2022 05:42 AM  Sherrilyn Rist MD NPI: [id number]

## 2022-10-24 ENCOUNTER — Telehealth: Payer: Self-pay | Admitting: Pulmonary Disease

## 2022-10-24 NOTE — Telephone Encounter (Signed)
Called and spoke to patients mother and she states years ago he was not willing to do cpap therapy took the machine off and returned it. Mother states he is in a group home now and she is thinking that he will not do cpap again. She is wanting to know if Dr Val Eagle has any other recommendations  Please advise sir

## 2022-10-24 NOTE — Telephone Encounter (Signed)
Called the pt and there was no answer- LMTCB    

## 2022-10-24 NOTE — Telephone Encounter (Signed)
PT's mom, Ms. Hansel Starling calling saying she missed call on results. Pls CN @ [phone]. TY

## 2022-10-25 NOTE — Telephone Encounter (Signed)
No other good ways of treating  Unless we want to consider surgical options of treatment-referral to ENT surgeon to see if he can be a surgical candidate  CPAP requires patient's buy in to use it and for it to be useful

## 2022-10-25 NOTE — Telephone Encounter (Signed)
Called and left mother Kara Mead a voicemail to call office back with recommendations from Dr Val Eagle with her son and his cpap.

## 2022-10-29 DIAGNOSIS — M6281 Muscle weakness (generalized): Secondary | ICD-10-CM | POA: Diagnosis not present

## 2022-10-29 DIAGNOSIS — R279 Unspecified lack of coordination: Secondary | ICD-10-CM | POA: Diagnosis not present

## 2022-10-29 DIAGNOSIS — Z9181 History of falling: Secondary | ICD-10-CM | POA: Diagnosis not present

## 2022-10-29 NOTE — Telephone Encounter (Signed)
I called and spoke with the pt's mother, OK per DPR. I let her know of results/recs. She states she does not think pt will agree to try CPAP again. I called and spoke with the pt and he understands has OSA and tx is CPAP but he does not want [place] to order this. He states he tried before and it was uncomfortable and does not want to try it again. Forwarding back to Dr Wynona Neat as Lorain Childes.

## 2022-10-30 DIAGNOSIS — R0602 Shortness of breath: Secondary | ICD-10-CM | POA: Diagnosis not present

## 2022-10-30 DIAGNOSIS — J4540 Moderate persistent asthma, uncomplicated: Secondary | ICD-10-CM | POA: Diagnosis not present

## 2022-10-30 NOTE — Telephone Encounter (Signed)
Will see in follow-up as scheduled  Schedule for follow-up to discuss options

## 2022-10-31 DIAGNOSIS — M6281 Muscle weakness (generalized): Secondary | ICD-10-CM | POA: Diagnosis not present

## 2022-10-31 DIAGNOSIS — Z9181 History of falling: Secondary | ICD-10-CM | POA: Diagnosis not present

## 2022-10-31 DIAGNOSIS — R279 Unspecified lack of coordination: Secondary | ICD-10-CM | POA: Diagnosis not present

## 2022-10-31 NOTE — Telephone Encounter (Signed)
Called patient but there was no answer. Left voicemail asking him to call office back to make follow up visit with Dr Val Eagle to go over treatment options.

## 2022-11-05 DIAGNOSIS — R279 Unspecified lack of coordination: Secondary | ICD-10-CM | POA: Diagnosis not present

## 2022-11-05 DIAGNOSIS — Z9181 History of falling: Secondary | ICD-10-CM | POA: Diagnosis not present

## 2022-11-05 DIAGNOSIS — M6281 Muscle weakness (generalized): Secondary | ICD-10-CM | POA: Diagnosis not present

## 2022-11-07 DIAGNOSIS — I10 Essential (primary) hypertension: Secondary | ICD-10-CM | POA: Diagnosis not present

## 2022-11-07 DIAGNOSIS — F319 Bipolar disorder, unspecified: Secondary | ICD-10-CM | POA: Diagnosis not present

## 2022-11-07 DIAGNOSIS — F209 Schizophrenia, unspecified: Secondary | ICD-10-CM | POA: Diagnosis not present

## 2022-11-07 DIAGNOSIS — D519 Vitamin B12 deficiency anemia, unspecified: Secondary | ICD-10-CM | POA: Diagnosis not present

## 2022-11-07 DIAGNOSIS — E785 Hyperlipidemia, unspecified: Secondary | ICD-10-CM | POA: Diagnosis not present

## 2022-11-07 DIAGNOSIS — E559 Vitamin D deficiency, unspecified: Secondary | ICD-10-CM | POA: Diagnosis not present

## 2022-11-27 DIAGNOSIS — R279 Unspecified lack of coordination: Secondary | ICD-10-CM | POA: Diagnosis not present

## 2022-11-27 DIAGNOSIS — Z9181 History of falling: Secondary | ICD-10-CM | POA: Diagnosis not present

## 2022-11-27 DIAGNOSIS — M6281 Muscle weakness (generalized): Secondary | ICD-10-CM | POA: Diagnosis not present

## 2022-11-28 DIAGNOSIS — R634 Abnormal weight loss: Secondary | ICD-10-CM | POA: Diagnosis not present

## 2022-11-28 DIAGNOSIS — J45998 Other asthma: Secondary | ICD-10-CM | POA: Diagnosis not present

## 2022-11-28 DIAGNOSIS — R059 Cough, unspecified: Secondary | ICD-10-CM | POA: Diagnosis not present

## 2022-11-28 DIAGNOSIS — G4730 Sleep apnea, unspecified: Secondary | ICD-10-CM | POA: Diagnosis not present

## 2022-11-29 DIAGNOSIS — M6281 Muscle weakness (generalized): Secondary | ICD-10-CM | POA: Diagnosis not present

## 2022-11-29 DIAGNOSIS — R279 Unspecified lack of coordination: Secondary | ICD-10-CM | POA: Diagnosis not present

## 2022-11-29 DIAGNOSIS — Z9181 History of falling: Secondary | ICD-10-CM | POA: Diagnosis not present

## 2022-12-03 DIAGNOSIS — Z9181 History of falling: Secondary | ICD-10-CM | POA: Diagnosis not present

## 2022-12-03 DIAGNOSIS — M6281 Muscle weakness (generalized): Secondary | ICD-10-CM | POA: Diagnosis not present

## 2022-12-03 DIAGNOSIS — R279 Unspecified lack of coordination: Secondary | ICD-10-CM | POA: Diagnosis not present

## 2022-12-06 DIAGNOSIS — R279 Unspecified lack of coordination: Secondary | ICD-10-CM | POA: Diagnosis not present

## 2022-12-06 DIAGNOSIS — Z9181 History of falling: Secondary | ICD-10-CM | POA: Diagnosis not present

## 2022-12-06 DIAGNOSIS — M6281 Muscle weakness (generalized): Secondary | ICD-10-CM | POA: Diagnosis not present

## 2022-12-16 DIAGNOSIS — F209 Schizophrenia, unspecified: Secondary | ICD-10-CM | POA: Diagnosis not present

## 2022-12-16 DIAGNOSIS — E559 Vitamin D deficiency, unspecified: Secondary | ICD-10-CM | POA: Diagnosis not present

## 2022-12-16 DIAGNOSIS — R1312 Dysphagia, oropharyngeal phase: Secondary | ICD-10-CM | POA: Diagnosis not present

## 2022-12-16 DIAGNOSIS — G4730 Sleep apnea, unspecified: Secondary | ICD-10-CM | POA: Diagnosis not present

## 2022-12-16 DIAGNOSIS — I10 Essential (primary) hypertension: Secondary | ICD-10-CM | POA: Diagnosis not present

## 2022-12-16 DIAGNOSIS — F99 Mental disorder, not otherwise specified: Secondary | ICD-10-CM | POA: Diagnosis not present

## 2022-12-16 DIAGNOSIS — J45998 Other asthma: Secondary | ICD-10-CM | POA: Diagnosis not present

## 2022-12-16 DIAGNOSIS — D519 Vitamin B12 deficiency anemia, unspecified: Secondary | ICD-10-CM | POA: Diagnosis not present

## 2022-12-16 DIAGNOSIS — R634 Abnormal weight loss: Secondary | ICD-10-CM | POA: Diagnosis not present

## 2022-12-16 DIAGNOSIS — F319 Bipolar disorder, unspecified: Secondary | ICD-10-CM | POA: Diagnosis not present

## 2022-12-18 DIAGNOSIS — E663 Overweight: Secondary | ICD-10-CM | POA: Diagnosis not present

## 2022-12-18 DIAGNOSIS — F819 Developmental disorder of scholastic skills, unspecified: Secondary | ICD-10-CM | POA: Diagnosis not present

## 2022-12-18 DIAGNOSIS — R4189 Other symptoms and signs involving cognitive functions and awareness: Secondary | ICD-10-CM | POA: Diagnosis not present

## 2022-12-18 DIAGNOSIS — F209 Schizophrenia, unspecified: Secondary | ICD-10-CM | POA: Diagnosis not present

## 2022-12-23 DIAGNOSIS — R7303 Prediabetes: Secondary | ICD-10-CM | POA: Diagnosis not present

## 2023-01-09 NOTE — Telephone Encounter (Signed)
Left detailed msg to call back and leave new msg if needing our assistance Closing encounter per protocol

## 2023-01-10 DIAGNOSIS — G4730 Sleep apnea, unspecified: Secondary | ICD-10-CM | POA: Diagnosis not present

## 2023-01-10 DIAGNOSIS — R1312 Dysphagia, oropharyngeal phase: Secondary | ICD-10-CM | POA: Diagnosis not present

## 2023-01-10 DIAGNOSIS — D519 Vitamin B12 deficiency anemia, unspecified: Secondary | ICD-10-CM | POA: Diagnosis not present

## 2023-01-10 DIAGNOSIS — I10 Essential (primary) hypertension: Secondary | ICD-10-CM | POA: Diagnosis not present

## 2023-01-10 DIAGNOSIS — E559 Vitamin D deficiency, unspecified: Secondary | ICD-10-CM | POA: Diagnosis not present

## 2023-01-10 DIAGNOSIS — F319 Bipolar disorder, unspecified: Secondary | ICD-10-CM | POA: Diagnosis not present

## 2023-01-10 DIAGNOSIS — F209 Schizophrenia, unspecified: Secondary | ICD-10-CM | POA: Diagnosis not present

## 2023-01-10 DIAGNOSIS — J45998 Other asthma: Secondary | ICD-10-CM | POA: Diagnosis not present

## 2023-01-10 DIAGNOSIS — R634 Abnormal weight loss: Secondary | ICD-10-CM | POA: Diagnosis not present

## 2023-01-10 DIAGNOSIS — F99 Mental disorder, not otherwise specified: Secondary | ICD-10-CM | POA: Diagnosis not present

## 2023-01-30 DIAGNOSIS — L851 Acquired keratosis [keratoderma] palmaris et plantaris: Secondary | ICD-10-CM | POA: Diagnosis not present

## 2023-01-30 DIAGNOSIS — G40909 Epilepsy, unspecified, not intractable, without status epilepticus: Secondary | ICD-10-CM | POA: Diagnosis not present

## 2023-01-30 DIAGNOSIS — M79674 Pain in right toe(s): Secondary | ICD-10-CM | POA: Diagnosis not present

## 2023-01-30 DIAGNOSIS — R2689 Other abnormalities of gait and mobility: Secondary | ICD-10-CM | POA: Diagnosis not present

## 2023-01-30 DIAGNOSIS — L603 Nail dystrophy: Secondary | ICD-10-CM | POA: Diagnosis not present

## 2023-01-30 DIAGNOSIS — B351 Tinea unguium: Secondary | ICD-10-CM | POA: Diagnosis not present

## 2023-01-30 DIAGNOSIS — M79675 Pain in left toe(s): Secondary | ICD-10-CM | POA: Diagnosis not present

## 2023-02-03 DIAGNOSIS — F319 Bipolar disorder, unspecified: Secondary | ICD-10-CM | POA: Diagnosis not present

## 2023-02-03 DIAGNOSIS — R1312 Dysphagia, oropharyngeal phase: Secondary | ICD-10-CM | POA: Diagnosis not present

## 2023-02-03 DIAGNOSIS — F99 Mental disorder, not otherwise specified: Secondary | ICD-10-CM | POA: Diagnosis not present

## 2023-02-03 DIAGNOSIS — J45998 Other asthma: Secondary | ICD-10-CM | POA: Diagnosis not present

## 2023-02-03 DIAGNOSIS — I10 Essential (primary) hypertension: Secondary | ICD-10-CM | POA: Diagnosis not present

## 2023-02-03 DIAGNOSIS — R634 Abnormal weight loss: Secondary | ICD-10-CM | POA: Diagnosis not present

## 2023-02-03 DIAGNOSIS — F209 Schizophrenia, unspecified: Secondary | ICD-10-CM | POA: Diagnosis not present

## 2023-02-03 DIAGNOSIS — E559 Vitamin D deficiency, unspecified: Secondary | ICD-10-CM | POA: Diagnosis not present

## 2023-02-03 DIAGNOSIS — G4730 Sleep apnea, unspecified: Secondary | ICD-10-CM | POA: Diagnosis not present

## 2023-02-03 DIAGNOSIS — D519 Vitamin B12 deficiency anemia, unspecified: Secondary | ICD-10-CM | POA: Diagnosis not present

## 2023-02-17 DIAGNOSIS — J45998 Other asthma: Secondary | ICD-10-CM | POA: Diagnosis not present

## 2023-02-17 DIAGNOSIS — F319 Bipolar disorder, unspecified: Secondary | ICD-10-CM | POA: Diagnosis not present

## 2023-02-17 DIAGNOSIS — I10 Essential (primary) hypertension: Secondary | ICD-10-CM | POA: Diagnosis not present

## 2023-02-17 DIAGNOSIS — G4730 Sleep apnea, unspecified: Secondary | ICD-10-CM | POA: Diagnosis not present

## 2023-02-17 DIAGNOSIS — F209 Schizophrenia, unspecified: Secondary | ICD-10-CM | POA: Diagnosis not present

## 2023-02-17 DIAGNOSIS — R7303 Prediabetes: Secondary | ICD-10-CM | POA: Diagnosis not present

## 2023-03-12 DIAGNOSIS — I10 Essential (primary) hypertension: Secondary | ICD-10-CM | POA: Diagnosis not present

## 2023-03-12 DIAGNOSIS — G4730 Sleep apnea, unspecified: Secondary | ICD-10-CM | POA: Diagnosis not present

## 2023-03-12 DIAGNOSIS — J45998 Other asthma: Secondary | ICD-10-CM | POA: Diagnosis not present

## 2023-03-14 DIAGNOSIS — F209 Schizophrenia, unspecified: Secondary | ICD-10-CM | POA: Diagnosis not present

## 2023-03-14 DIAGNOSIS — R4189 Other symptoms and signs involving cognitive functions and awareness: Secondary | ICD-10-CM | POA: Diagnosis not present

## 2023-03-14 DIAGNOSIS — F819 Developmental disorder of scholastic skills, unspecified: Secondary | ICD-10-CM | POA: Diagnosis not present

## 2023-03-24 DIAGNOSIS — I10 Essential (primary) hypertension: Secondary | ICD-10-CM | POA: Diagnosis not present

## 2023-03-24 DIAGNOSIS — F319 Bipolar disorder, unspecified: Secondary | ICD-10-CM | POA: Diagnosis not present

## 2023-03-24 DIAGNOSIS — D519 Vitamin B12 deficiency anemia, unspecified: Secondary | ICD-10-CM | POA: Diagnosis not present

## 2023-03-24 DIAGNOSIS — E785 Hyperlipidemia, unspecified: Secondary | ICD-10-CM | POA: Diagnosis not present

## 2023-03-24 DIAGNOSIS — E559 Vitamin D deficiency, unspecified: Secondary | ICD-10-CM | POA: Diagnosis not present

## 2023-06-05 DIAGNOSIS — E559 Vitamin D deficiency, unspecified: Secondary | ICD-10-CM | POA: Diagnosis not present

## 2023-06-05 DIAGNOSIS — K219 Gastro-esophageal reflux disease without esophagitis: Secondary | ICD-10-CM | POA: Diagnosis not present

## 2023-06-05 DIAGNOSIS — E785 Hyperlipidemia, unspecified: Secondary | ICD-10-CM | POA: Diagnosis not present

## 2023-06-05 DIAGNOSIS — F209 Schizophrenia, unspecified: Secondary | ICD-10-CM | POA: Diagnosis not present

## 2023-06-05 DIAGNOSIS — J45998 Other asthma: Secondary | ICD-10-CM | POA: Diagnosis not present

## 2023-06-05 DIAGNOSIS — R7989 Other specified abnormal findings of blood chemistry: Secondary | ICD-10-CM | POA: Diagnosis not present

## 2023-06-05 DIAGNOSIS — I10 Essential (primary) hypertension: Secondary | ICD-10-CM | POA: Diagnosis not present

## 2023-06-05 DIAGNOSIS — F319 Bipolar disorder, unspecified: Secondary | ICD-10-CM | POA: Diagnosis not present

## 2023-06-05 DIAGNOSIS — G4730 Sleep apnea, unspecified: Secondary | ICD-10-CM | POA: Diagnosis not present

## 2023-06-05 DIAGNOSIS — R0989 Other specified symptoms and signs involving the circulatory and respiratory systems: Secondary | ICD-10-CM | POA: Diagnosis not present

## 2023-06-09 DIAGNOSIS — F209 Schizophrenia, unspecified: Secondary | ICD-10-CM | POA: Diagnosis not present

## 2023-06-09 DIAGNOSIS — F819 Developmental disorder of scholastic skills, unspecified: Secondary | ICD-10-CM | POA: Diagnosis not present

## 2023-06-09 DIAGNOSIS — R4189 Other symptoms and signs involving cognitive functions and awareness: Secondary | ICD-10-CM | POA: Diagnosis not present

## 2023-06-09 DIAGNOSIS — Z79899 Other long term (current) drug therapy: Secondary | ICD-10-CM | POA: Diagnosis not present

## 2023-06-09 DIAGNOSIS — R7989 Other specified abnormal findings of blood chemistry: Secondary | ICD-10-CM | POA: Diagnosis not present

## 2023-06-23 DIAGNOSIS — R7303 Prediabetes: Secondary | ICD-10-CM | POA: Diagnosis not present

## 2023-07-25 DIAGNOSIS — I10 Essential (primary) hypertension: Secondary | ICD-10-CM | POA: Diagnosis not present

## 2023-07-25 DIAGNOSIS — J4540 Moderate persistent asthma, uncomplicated: Secondary | ICD-10-CM | POA: Diagnosis not present

## 2023-07-25 DIAGNOSIS — R2681 Unsteadiness on feet: Secondary | ICD-10-CM | POA: Diagnosis not present

## 2023-07-25 DIAGNOSIS — F99 Mental disorder, not otherwise specified: Secondary | ICD-10-CM | POA: Diagnosis not present

## 2023-08-07 ENCOUNTER — Emergency Department: Payer: PPO

## 2023-08-07 ENCOUNTER — Inpatient Hospital Stay: Payer: PPO

## 2023-08-07 ENCOUNTER — Observation Stay
Admission: EM | Admit: 2023-08-07 | Discharge: 2023-08-08 | Disposition: A | Discharge status: Home / Self Care | Payer: PPO | Attending: Internal Medicine | Admitting: Internal Medicine

## 2023-08-07 DIAGNOSIS — I6381 Other cerebral infarction due to occlusion or stenosis of small artery: Principal | ICD-10-CM | POA: Insufficient documentation

## 2023-08-07 DIAGNOSIS — Z8673 Personal history of transient ischemic attack (TIA), and cerebral infarction without residual deficits: Secondary | ICD-10-CM | POA: Diagnosis not present

## 2023-08-07 DIAGNOSIS — J329 Chronic sinusitis, unspecified: Secondary | ICD-10-CM | POA: Diagnosis not present

## 2023-08-07 DIAGNOSIS — I639 Cerebral infarction, unspecified: Secondary | ICD-10-CM | POA: Diagnosis not present

## 2023-08-07 DIAGNOSIS — J45909 Unspecified asthma, uncomplicated: Secondary | ICD-10-CM | POA: Diagnosis not present

## 2023-08-07 DIAGNOSIS — Z7982 Long term (current) use of aspirin: Secondary | ICD-10-CM | POA: Diagnosis not present

## 2023-08-07 DIAGNOSIS — R9089 Other abnormal findings on diagnostic imaging of central nervous system: Secondary | ICD-10-CM | POA: Diagnosis not present

## 2023-08-07 DIAGNOSIS — Z79899 Other long term (current) drug therapy: Secondary | ICD-10-CM | POA: Diagnosis not present

## 2023-08-07 DIAGNOSIS — Z87891 Personal history of nicotine dependence: Secondary | ICD-10-CM | POA: Diagnosis not present

## 2023-08-07 DIAGNOSIS — G459 Transient cerebral ischemic attack, unspecified: Secondary | ICD-10-CM

## 2023-08-07 DIAGNOSIS — R918 Other nonspecific abnormal finding of lung field: Secondary | ICD-10-CM | POA: Diagnosis not present

## 2023-08-07 DIAGNOSIS — R29810 Facial weakness: Secondary | ICD-10-CM | POA: Diagnosis not present

## 2023-08-07 DIAGNOSIS — R531 Weakness: Principal | ICD-10-CM | POA: Insufficient documentation

## 2023-08-07 DIAGNOSIS — R0989 Other specified symptoms and signs involving the circulatory and respiratory systems: Secondary | ICD-10-CM | POA: Diagnosis not present

## 2023-08-07 DIAGNOSIS — I6300 Cerebral infarction due to thrombosis of unspecified precerebral artery: Secondary | ICD-10-CM | POA: Diagnosis not present

## 2023-08-07 DIAGNOSIS — R4781 Slurred speech: Secondary | ICD-10-CM | POA: Insufficient documentation

## 2023-08-07 DIAGNOSIS — I10 Essential (primary) hypertension: Secondary | ICD-10-CM | POA: Diagnosis not present

## 2023-08-07 DIAGNOSIS — I6523 Occlusion and stenosis of bilateral carotid arteries: Secondary | ICD-10-CM | POA: Diagnosis not present

## 2023-08-07 DIAGNOSIS — R001 Bradycardia, unspecified: Secondary | ICD-10-CM | POA: Diagnosis not present

## 2023-08-07 LAB — CBC
HCT: 42.5 % (ref 39.0–52.0)
Hemoglobin: 14.3 g/dL (ref 13.0–17.0)
MCH: 31.5 pg (ref 26.0–34.0)
MCHC: 33.6 g/dL (ref 30.0–36.0)
MCV: 93.6 fL (ref 80.0–100.0)
Platelets: 201 10*3/uL (ref 150–400)
RBC: 4.54 MIL/uL (ref 4.22–5.81)
RDW: 13.6 % (ref 11.5–15.5)
WBC: 11.3 10*3/uL — ABNORMAL HIGH (ref 4.0–10.5)
nRBC: 0 % (ref 0.0–0.2)

## 2023-08-07 LAB — DIFFERENTIAL
Abs Immature Granulocytes: 0.05 10*3/uL (ref 0.00–0.07)
Basophils Absolute: 0 10*3/uL (ref 0.0–0.1)
Basophils Relative: 0 %
Eosinophils Absolute: 0 10*3/uL (ref 0.0–0.5)
Eosinophils Relative: 0 %
Immature Granulocytes: 0 %
Lymphocytes Relative: 11 %
Lymphs Abs: 1.3 10*3/uL (ref 0.7–4.0)
Monocytes Absolute: 1 10*3/uL (ref 0.1–1.0)
Monocytes Relative: 9 %
Neutro Abs: 9 10*3/uL — ABNORMAL HIGH (ref 1.7–7.7)
Neutrophils Relative %: 80 %

## 2023-08-07 LAB — COMPREHENSIVE METABOLIC PANEL
ALT: 24 U/L (ref 0–44)
AST: 35 U/L (ref 15–41)
Albumin: 3.3 g/dL — ABNORMAL LOW (ref 3.5–5.0)
Alkaline Phosphatase: 49 U/L (ref 38–126)
Anion gap: 8 (ref 5–15)
BUN: 19 mg/dL (ref 6–20)
CO2: 27 mmol/L (ref 22–32)
Calcium: 9 mg/dL (ref 8.9–10.3)
Chloride: 103 mmol/L (ref 98–111)
Creatinine, Ser: 1.27 mg/dL — ABNORMAL HIGH (ref 0.61–1.24)
GFR, Estimated: >60 mL/min (ref >60)
Glucose, Bld: 99 mg/dL (ref 70–99)
Potassium: 4 mmol/L (ref 3.5–5.1)
Sodium: 138 mmol/L (ref 135–145)
Total Bilirubin: 0.4 mg/dL (ref 0.3–1.2)
Total Protein: 7.3 g/dL (ref 6.5–8.1)

## 2023-08-07 LAB — ETHANOL: Alcohol, Ethyl (B): <10 mg/dL (ref <10)

## 2023-08-07 LAB — CBG MONITORING, ED: Glucose-Capillary: 112 mg/dL — ABNORMAL HIGH (ref 70–99)

## 2023-08-07 LAB — PROTIME-INR
INR: 1 (ref 0.8–1.2)
Prothrombin Time: 13.8 s (ref 11.4–15.2)

## 2023-08-07 LAB — APTT: aPTT: 32 s (ref 24–36)

## 2023-08-07 MED ORDER — CLOPIDOGREL BISULFATE 75 MG PO TABS: 300.0000 mg | ORAL_TABLET | Freq: Once | ORAL | Status: DC

## 2023-08-07 MED ORDER — SODIUM CHLORIDE 0.9 % IV SOLN: INTRAVENOUS | Status: DC

## 2023-08-07 MED ORDER — STROKE: EARLY STAGES OF RECOVERY BOOK: Freq: Once | Status: AC

## 2023-08-07 MED ORDER — THIAMINE HCL 100 MG/ML IJ SOLN
500.0000 mg | Freq: Three times a day (TID) | INTRAVENOUS | Status: DC
  Administered 2023-08-07 – 2023-08-08 (×2): 500 mg via INTRAVENOUS
  Filled 2023-08-07 (×4): qty 5

## 2023-08-07 MED ORDER — SODIUM CHLORIDE 0.9% FLUSH
3.0000 mL | Freq: Once | INTRAVENOUS | Status: AC
  Administered 2023-08-07: 3 mL via INTRAVENOUS

## 2023-08-07 MED ORDER — PALIPERIDONE ER 3 MG PO TB24
6.0000 mg | ORAL_TABLET | Freq: Every day | ORAL | Status: DC
  Filled 2023-08-07 (×2): qty 2

## 2023-08-07 MED ORDER — ASPIRIN 300 MG RE SUPP: 300.0000 mg | Freq: Every day | RECTAL | Status: DC

## 2023-08-07 MED ORDER — MOMETASONE FURO-FORMOTEROL FUM 200-5 MCG/ACT IN AERO
2.0000 | INHALATION_SPRAY | Freq: Two times a day (BID) | RESPIRATORY_TRACT | Status: DC
  Administered 2023-08-07 – 2023-08-08 (×2): 2 via RESPIRATORY_TRACT
  Filled 2023-08-07 (×2): qty 8.8

## 2023-08-07 MED ORDER — ALBUTEROL SULFATE (2.5 MG/3ML) 0.083% IN NEBU: 3.0000 mL | INHALATION_SOLUTION | RESPIRATORY_TRACT | Status: DC | PRN

## 2023-08-07 MED ORDER — ASPIRIN 325 MG PO TBEC: 325.0000 mg | DELAYED_RELEASE_TABLET | Freq: Every day | ORAL | Status: DC

## 2023-08-07 MED ORDER — METOPROLOL TARTRATE 50 MG PO TABS
50.0000 mg | ORAL_TABLET | Freq: Two times a day (BID) | ORAL | Status: DC
  Administered 2023-08-08: 50 mg via ORAL
  Filled 2023-08-07: qty 2
  Filled 2023-08-07: qty 1

## 2023-08-07 MED ORDER — THIAMINE HCL 100 MG/ML IJ SOLN: 500.0000 mg | Freq: Three times a day (TID) | INTRAVENOUS | Status: DC

## 2023-08-07 MED ORDER — METOPROLOL SUCCINATE ER 50 MG PO TB24: 100.0000 mg | ORAL_TABLET | Freq: Every day | ORAL | Status: DC

## 2023-08-07 MED ORDER — ACETAMINOPHEN 650 MG RE SUPP: 650.0000 mg | RECTAL | Status: DC | PRN

## 2023-08-07 MED ORDER — OXYCODONE HCL 5 MG PO TABS: 5.0000 mg | ORAL_TABLET | ORAL | Status: DC | PRN

## 2023-08-07 MED ORDER — PANTOPRAZOLE SODIUM 40 MG PO TBEC
40.0000 mg | DELAYED_RELEASE_TABLET | Freq: Every day | ORAL | Status: DC
  Administered 2023-08-08: 40 mg via ORAL
  Filled 2023-08-07: qty 1

## 2023-08-07 MED ORDER — HYDRALAZINE HCL 20 MG/ML IJ SOLN: 5.0000 mg | Freq: Four times a day (QID) | INTRAMUSCULAR | Status: DC | PRN

## 2023-08-07 MED ORDER — ASPIRIN 325 MG PO TBEC
325.0000 mg | DELAYED_RELEASE_TABLET | Freq: Every day | ORAL | Status: DC
  Administered 2023-08-08: 325 mg via ORAL
  Filled 2023-08-07: qty 1

## 2023-08-07 MED ORDER — IOHEXOL 350 MG/ML SOLN
100.0000 mL | Freq: Once | INTRAVENOUS | Status: AC | PRN
  Administered 2023-08-07: 100 mL via INTRAVENOUS

## 2023-08-07 MED ORDER — ACETAMINOPHEN 325 MG PO TABS: 650.0000 mg | ORAL_TABLET | ORAL | Status: DC | PRN

## 2023-08-07 MED ORDER — BENZTROPINE MESYLATE 1 MG PO TABS
1.0000 mg | ORAL_TABLET | Freq: Three times a day (TID) | ORAL | Status: DC
  Administered 2023-08-08 (×2): 1 mg via ORAL
  Filled 2023-08-07 (×3): qty 1

## 2023-08-07 MED ORDER — ENOXAPARIN SODIUM 60 MG/0.6ML IJ SOSY
55.0000 mg | PREFILLED_SYRINGE | INTRAMUSCULAR | Status: DC
  Administered 2023-08-07: 55 mg via SUBCUTANEOUS
  Filled 2023-08-07: qty 0.6

## 2023-08-07 MED ORDER — DIVALPROEX SODIUM ER 500 MG PO TB24
2000.0000 mg | ORAL_TABLET | Freq: Every day | ORAL | Status: DC
  Filled 2023-08-07: qty 4
  Filled 2023-08-07: qty 8

## 2023-08-07 MED ORDER — ACETAMINOPHEN 160 MG/5ML PO SOLN: 650.0000 mg | ORAL | Status: DC | PRN

## 2023-08-07 MED ORDER — DOCUSATE SODIUM 100 MG PO CAPS
100.0000 mg | ORAL_CAPSULE | Freq: Two times a day (BID) | ORAL | Status: DC
  Administered 2023-08-08: 100 mg via ORAL
  Filled 2023-08-07 (×2): qty 1

## 2023-08-07 MED ORDER — SENNOSIDES-DOCUSATE SODIUM 8.6-50 MG PO TABS: 1.0000 | ORAL_TABLET | Freq: Every evening | ORAL | Status: DC | PRN

## 2023-08-07 MED ORDER — CLOPIDOGREL BISULFATE 75 MG PO TABS
75.0000 mg | ORAL_TABLET | Freq: Every day | ORAL | Status: DC
  Administered 2023-08-08: 75 mg via ORAL
  Filled 2023-08-07: qty 1

## 2023-08-07 NOTE — Evaluation (Signed)
Clinical/Bedside Swallow Evaluation Patient Details  Name: [name] [name] MRN: [id number] Date of Birth: 1966/05/08  Today's Date: 08/07/2023 Time: SLP Start Time (ACUTE ONLY): 1505 SLP Stop Time (ACUTE ONLY): 1605 SLP Time Calculation (min) (ACUTE ONLY): 60 min  Past Medical History:  Past Medical History:  Diagnosis Date   Acute bronchitis    Asthma    Depression    Hypertension    Schizophrenia (HCC)    Past Surgical History:  Past Surgical History:  Procedure Laterality Date   ORIF ANKLE FRACTURE Right 12/07/2021   Procedure: OPEN REDUCTION INTERNAL FIXATION (ORIF) ANKLE FRACTURE;  Surgeon: Juanell Fairly, MD;  Location: [facility] ORS;  Service: Orthopedics;  Laterality: Right;   HPI:  Pt is a 57 y.o. male with a history of Schizophrenia, Bipolar dis., asthma, Obesity, Sleep Apnea, mental dis. per chart, depression, htn, hld, Falls, and previous CVA.  He was last normal last night, and then on awakening this morning, he was found to have severely slurred speech, lethargy, and left sided weakness, which is new.  He resides at [facility] and at baseline is able to perform some ADLs.   MRI: No acute intracranial abnormality.  2. Mild T2 hyperintense signal abnormality in the periaqueductal  gray matter, which is nonspecific but can be seen in the setting of  Wernicke's encephalopathy. Correlate for history of chronic  malnutrition.  3. Advanced generalized volume loss.   CXR: Low lung volumes with atelectasis or scarring in the left lower lung.    Assessment / Plan / Recommendation  Clinical Impression   Pt seen for BSE this afternoon. Pt drowsy but could alert/awaken to verbal/tactile stim and cues. He could NOT maintain a Full alert State w/out constant cues. He was oriented to name only w/ this SLP. He followed a simple command intermittently but again, impacted by State decline.  On RA, afebrile. WBC 11.3.   Pt appears to present w/ oropharyngeal phase dysphagia in setting  of poor State alertness and decreased awareness overall. He exhibited clinical s/s of oropharyngeal phase dysphagia, sensorimotor deficits apparent. Pt consumed po trials w/ No immediate, overt, clinical s/s of aspiration during the po intake, though softer signs were noted including velopharyngeal noises/incompetency -- unsure if related to pt's Baseline presentation and Sleep Apnea.  Pt appears at increased risk for aspiration/aspiration pneumonia at this time. A safe oral diet consistency cannot be recommended currently.   Pt has challenging factors that could impact oropharyngeal swallowing to include decreased alertness and declined State, weakness, no attempt at self-feeding, and reported oropharyngeal phase dysphagia (per chart note). Pt does have baseline dxs of CVA and Psychiatric dis. These factors can increase risk for aspiration, dysphagia as well as decreased oral intake overall.   During po trials, pt consumed trials of single ice chips, then small tsps of Nectar liquids and puree w/ no overt coughing, decline in vocal quality, or change in respiratory presentation during/post trials. O2 sats remained 96%. Noted were multiple, audible swallows and velopharyngeal noises. Oral phase appeared impacted by decreased alertness: min increased oral phase time for bolus management and control of bolus propulsion for A-P transfer for swallowing. Oral clearing was appropriate w/ all trial consistencies -- no oral residue remained.  OM Exam was cursory d/t pt's reduced follow through w/ OM commands but appeared [facility] for bolus control w/ no unilateral weakness noted. Speech muttered.   Recommend continue a strict NPO status at this time w/ ST services following for ongoing assessment to  establish a least restrictive oral diet. Frequent oral care for hygiene and stimulation of swallowing; aspiration precautions. ST services will f/u next 1-2 days.  Education given pt's BSE to NSG/MD and recommendations. MD  agreed. Recommend Dietician f/u for support. SLP Visit Diagnosis: Dysphagia, oropharyngeal phase (R13.12) (Lethargy/drowsiness)    Aspiration Risk  Moderate aspiration risk;Risk for inadequate nutrition/hydration    Diet Recommendation   NPO  Medication Administration: Via alternative means    Other  Recommendations Recommended Consults:  (Dietician f/u) Oral Care Recommendations: Oral care QID;Staff/trained caregiver to provide oral care Caregiver Recommendations:  (TBD)    Recommendations for follow up therapy are one component of a multi-disciplinary discharge planning process, led by the attending physician.  Recommendations may be updated based on patient status, additional functional criteria and insurance authorization.  Follow up Recommendations Follow physician's recommendations for discharge plan and follow up therapies      Assistance Recommended at Discharge  FULL  Functional Status Assessment Patient has had a recent decline in their functional status and demonstrates the ability to make significant improvements in function in a reasonable and predictable amount of time.  Frequency and Duration min 2x/week  2 weeks       Prognosis Prognosis for improved oropharyngeal function: Fair Barriers to Reach Goals: Cognitive deficits;Time post onset;Severity of deficits Barriers/Prognosis Comment: baseline Psychiatric dis., CVA, and mental dis per chart notes.      Swallow Study   General Date of Onset: 08/07/23 HPI: Pt is a 57 y.o. male with a history of Schizophrenia, Bipolar dis., asthma, Obesity, Sleep Apnea, mental dis. per chart, depression, htn, hld, Falls, and previous CVA.  He was last normal last night, and then on awakening this morning, he was found to have severely slurred speech, lethargy, and left sided weakness, which is new.  He resides at [facility] and at baseline is able to perform some ADLs.   MRI: No acute intracranial abnormality.  2. Mild T2  hyperintense signal abnormality in the periaqueductal  gray matter, which is nonspecific but can be seen in the setting of  Wernicke's encephalopathy. Correlate for history of chronic  malnutrition.  3. Advanced generalized volume loss.   CXR: Low lung volumes with atelectasis or scarring in the left lower lung. Type of Study: Bedside Swallow Evaluation Previous Swallow Assessment: none per chart though there is a noted dx in an office visit note of oropharyngeal phase dysphagia 02/2023. Diet Prior to this Study: NPO Temperature Spikes Noted: No (wbc 11.3) Respiratory Status: Room air (snoring) History of Recent Intubation: No Behavior/Cognition: Cooperative;Pleasant mood;Confused;Lethargic/Drowsy;Requires cueing;Distractible Oral Cavity Assessment: Within Functional Limits Oral Care Completed by SLP: Yes Oral Cavity - Dentition: Adequate natural dentition Vision:  (n./a) Self-Feeding Abilities: Total assist Patient Positioning: Upright in bed (full assist) Baseline Vocal Quality: Low vocal intensity (muttered speech) Volitional Cough: Cognitively unable to elicit Volitional Swallow: Unable to elicit    Oral/Motor/Sensory Function Overall Oral Motor/Sensory Function:  (no gross unilateral oral weakness noted; No lingual weakness, symmetrical)   Ice Chips Ice chips: Within functional limits (grossly) Presentation: Spoon (fed; 3 trials) Other Comments: min inattentive, drowsy but masticated chips appropriately followed by swallow   Thin Liquid Thin Liquid: Not tested    Nectar Thick Nectar Thick Liquid: Impaired Presentation: Spoon (8 trials) Oral Phase Impairments: Poor awareness of bolus Oral phase functional implications: Prolonged oral transit Pharyngeal Phase Impairments: Suspected delayed Swallow;Multiple swallows (Audible swallow; min velopharyngeal noises) Other Comments: unsure if impact from drowsiness and baseline  presentation   Honey Thick Honey Thick Liquid: Not tested    Puree Puree: Impaired Presentation: Spoon (fed; 6 trials) Oral Phase Impairments: Reduced lingual movement/coordination;Poor awareness of bolus Oral Phase Functional Implications: Prolonged oral transit Pharyngeal Phase Impairments: Suspected delayed Swallow;Multiple swallows (velopharyngeal noises) Other Comments: unsure if impact from drowsiness and baseline presentation   Solid     Solid: Not tested         Jerilynn Som, MS, CCC-SLP Speech Language Pathologist Rehab Services; [facility] [phone] (ascom) Mima Cranmore 08/07/2023,4:30 PM

## 2023-08-07 NOTE — Consult Note (Signed)
Neurology Consultation Reason for Consult: Left sided weakness Referring Physician: Cyril Loosen, R  CC: Left sided weakness  History is obtained from:patient  HPI: [name] [name] is a 57 y.o. male with a history of htn, previous stroke.  He was last normal last night, and then on awakening was found to have severe dysarthria and left sided weakness, which is new. He needs assistance with cooking, etc, but is able to go to the bathroom and doe his activities himself.    LKW: 10pm 10/2 tnk given?: no, out of window  Past Medical History:  Diagnosis Date   Acute bronchitis    Asthma    Depression    Hypertension    Schizophrenia (HCC)      No family history on file.   Social History:  reports that he has quit smoking. He does not have any smokeless tobacco history on file. He reports that he does not currently use alcohol. He reports that he does not currently use drugs.   Exam: Current vital signs: There were no vitals taken for this visit. Vital signs in last 24 hours: BP: ()/()  Arterial Line BP: ()/()    Physical Exam  Appears well-developed and well-nourished.   Neuro: Mental Status: Patient is awake, alert, oriented to person, month, age He is severely dysarthric, and I am barely able to understand the answers even when I know the expected answer (such as month and age) so difficult to judge aphasia, but he is readily able to follow commands Cranial Nerves: II: Visual Fields are full. Pupils are equal, round, and reactive to light.   III,IV, VI: EOMI without ptosis or diploplia.  V: Facial sensation is symmetric to temperature VII: Facial movement with left facial weakness VIII: hearing is intact to voice X: Uvula elevates symmetrically XI: Shoulder shrug is symmetric. XII: tongue is midline without atrophy or fasciculations.  Motor: He has drift in the right leg, but none in the right arm.  He is 4/5 weakness of the left arm and leg with fine motor movements  disproportionately affected. Sensory: Sensation is diminished on the left Cerebellar: He is ataxic out of proportion to weakness on the left   I have reviewed labs in epic and the results pertinent to this consultation are: Creatinine 1.27  I have reviewed the images obtained: CT/CTA-no bleeding or LVO  Impression: 57 year old male with what I strongly suspect to be an acute ischemic infarct and he will need to be admitted for secondary risk factor modification and therapy.  Given his bulbar dysfunction, speech therapy will need to play a big role.  Recommendations: - HgbA1c, fasting lipid panel - MRI of the brain without contrast - Frequent neuro checks - Echocardiogram - Prophylactic therapy-aspirin and Plavix if able to take p.o., otherwise would use aspirin suppository for the time being. - Risk factor modification - Telemetry monitoring - PT consult, OT consult, Speech consult   Ritta Slot, MD [facility] [phone]  If 7pm- 7am, please page neurology on call as listed in AMION.

## 2023-08-07 NOTE — H&P (Signed)
History and Physical    [name] [name]:[id number] DOB: Nov 29, 1965 DOA: 08/07/2023  PCP: Pcp, No (Confirm with patient/family/NH records and if not entered, this has to be entered at [facility] point of entry) Patient coming from: Home  I have personally briefly reviewed patient's old medical records in [facility]  Chief Complaint: Left sided weakness  HPI: [name] [name] is a 57 y.o. male with medical history significant of HTN, HLD, CVA, mild intermittent asthma, schizophrenia, presented with new onset of left-sided weakness.  Last known normal last night, this morning patient woke up with new onset of left-sided weakness, denies any numbness weakness of any of the limbs.  Patient also complaining about having 1 loose diarrhea this morning but no abdominal pain no nauseous vomiting no fever or chills.  ED Course: Afebrile, borderline tachycardia blood pressure elevated 137/100, saturation 93% on room air.  CT head showed suspected small H indeterminant infarct in the right lentiform nucleus, 5 cc infarct code identified in the left centrum semiovale with penumbra identified in the right temporal lobe as well as the left frontal lobe likely partial artificial.  CTA showed no significant stenosis occlusions or dissection.  MRI is pending.  Review of Systems: As per HPI otherwise 14 point review of systems negative.    Past Medical History:  Diagnosis Date   Acute bronchitis    Asthma    Depression    Hypertension    Schizophrenia (HCC)     Past Surgical History:  Procedure Laterality Date   ORIF ANKLE FRACTURE Right 12/07/2021   Procedure: OPEN REDUCTION INTERNAL FIXATION (ORIF) ANKLE FRACTURE;  Surgeon: Juanell Fairly, MD;  Location: [facility] ORS;  Service: Orthopedics;  Laterality: Right;     reports that he has quit smoking. He does not have any smokeless tobacco history on file. He reports that he does not currently use alcohol. He reports that he does not currently use  drugs.  Allergies  Allergen Reactions   Cefprozil    Cetirizine Hcl    Doxycycline     No family history on file.   Prior to Admission medications   Medication Sig Start Date End Date Taking? Authorizing Provider  budesonide-formoterol (SYMBICORT) 160-4.5 MCG/ACT inhaler Inhale 2 puffs into the lungs daily. 07/28/23  Yes [provider]  albuterol (VENTOLIN HFA) 108 (90 Base) MCG/ACT inhaler Inhale 2 puffs into the lungs every 4 (four) hours as needed for cough or congestion. 08/06/21   [provider]  ALLERGY RELIEF 10 MG tablet Take 10 mg by mouth daily.    [provider]  aspirin EC 325 MG tablet Take 1 tablet (325 mg total) by mouth daily. 12/11/21   Juanell Fairly, MD  atorvastatin (LIPITOR) 20 MG tablet Take 20 mg by mouth daily.    [provider]  benztropine (COGENTIN) 1 MG tablet Take 1 mg by mouth 3 (three) times daily. 10/04/21   [provider]  BREO ELLIPTA 200-25 MCG/ACT AEPB Inhale 1 puff into the lungs daily. Patient not taking: Reported on 07/10/2022 10/11/21   [provider]  cholecalciferol (VITAMIN D) 25 MCG (1000 UNIT) tablet Take 2,000 Units by mouth daily.    [provider]  divalproex (DEPAKOTE ER) 500 MG 24 hr tablet Take 2,000 mg by mouth at bedtime. 10/04/21   [provider]  docusate sodium (COLACE) 100 MG capsule Take 1 capsule (100 mg total) by mouth 2 (two) times daily. 12/11/21   Juanell Fairly, MD  fenofibrate Rosalio Loud)  48 MG tablet Take 48 mg by mouth daily.    [provider]  fluticasone-salmeterol (ADVAIR) 500-50 MCG/ACT AEPB Inhale 1 puff into the lungs 2 (two) times daily. 06/28/22   [provider]  furosemide (LASIX) 20 MG tablet Take 20 mg by mouth daily.    [provider]  meloxicam (MOBIC) 7.5 MG tablet Take 7.5 mg by mouth daily.    [provider]  metoprolol succinate (TOPROL-XL) 100 MG 24 hr tablet Take 1 tablet (100 mg total) by mouth  daily. Take with or immediately following a meal. 12/12/21   Alford Highland, MD  Multiple Vitamins-Minerals (MULTIVITAMIN WITH MINERALS) tablet Take 1 tablet by mouth daily.    [provider]  omeprazole (PRILOSEC) 20 MG capsule Take 20 mg by mouth daily. 10/04/21   [provider]  oxyCODONE (OXY IR/ROXICODONE) 5 MG immediate release tablet Take 1-2 tablets (5-10 mg total) by mouth every 4 (four) hours as needed for breakthrough pain ((for MODERATE breakthrough pain)). 12/11/21   Juanell Fairly, MD  paliperidone (INVEGA) 6 MG 24 hr tablet Take 6 mg by mouth at bedtime. 10/04/21   [provider]    Physical Exam: Vitals:   08/07/23 1130  BP: (!) 137/102  Pulse: 100  Resp: (!) 25  SpO2: 93%    Constitutional: NAD, calm, comfortable Vitals:   08/07/23 1130  BP: (!) 137/102  Pulse: 100  Resp: (!) 25  SpO2: 93%   Eyes: PERRL, lids and conjunctivae normal ENMT: Mucous membranes are moist. Posterior pharynx clear of any exudate or lesions.Normal dentition.  Neck: normal, supple, no masses, no thyromegaly Respiratory: clear to auscultation bilaterally, no wheezing, no crackles. Normal respiratory effort. No accessory muscle use.  Cardiovascular: Regular rate and rhythm, no murmurs / rubs / gallops. No extremity edema. 2+ pedal pulses. No carotid bruits.  Abdomen: no tenderness, no masses palpated. No hepatosplenomegaly. Bowel sounds positive.  Musculoskeletal: no clubbing / cyanosis. No joint deformity upper and lower extremities. Good ROM, no contractures. Normal muscle tone.  Skin: no rashes, lesions, ulcers. No induration Neurologic: CN 2-12 grossly intact. Sensation intact, DTR normal. Strength 4/5 on left side compared to 5/5 on the right side.  Psychiatric: Normal judgment and insight. Alert and oriented x 3. Normal mood.     Labs on Admission: I have personally reviewed following labs and imaging studies  CBC: Recent Labs  Lab 08/07/23 1050  WBC  11.3*  NEUTROABS 9.0*  HGB 14.3  HCT 42.5  MCV 93.6  PLT 201   Basic Metabolic Panel: Recent Labs  Lab 08/07/23 1050  NA 138  K 4.0  CL 103  CO2 27  GLUCOSE 99  BUN 19  CREATININE 1.27*  CALCIUM 9.0   GFR: CrCl cannot be calculated (Unknown ideal weight.). Liver Function Tests: Recent Labs  Lab 08/07/23 1050  AST 35  ALT 24  ALKPHOS 49  BILITOT 0.4  PROT 7.3  ALBUMIN 3.3*   No results for input(s): "LIPASE", "AMYLASE" in the last 168 hours. No results for input(s): "AMMONIA" in the last 168 hours. Coagulation Profile: Recent Labs  Lab 08/07/23 1050  INR 1.0   Cardiac Enzymes: No results for input(s): "CKTOTAL", "CKMB", "CKMBINDEX", "TROPONINI" in the last 168 hours. BNP (last 3 results) No results for input(s): "PROBNP" in the last 8760 hours. HbA1C: No results for input(s): "HGBA1C" in the last 72 hours. CBG: Recent Labs  Lab 08/07/23 1048  GLUCAP 112*   Lipid Profile: No results for input(s): "CHOL", "  HDL", "LDLCALC", "TRIG", "CHOLHDL", "LDLDIRECT" in the last 72 hours. Thyroid Function Tests: No results for input(s): "TSH", "T4TOTAL", "FREET4", "T3FREE", "THYROIDAB" in the last 72 hours. Anemia Panel: No results for input(s): "VITAMINB12", "FOLATE", "FERRITIN", "TIBC", "IRON", "RETICCTPCT" in the last 72 hours. Urine analysis:    Component Value Date/Time   COLORURINE YELLOW (A) 01/04/2022 2009   APPEARANCEUR CLEAR (A) 01/04/2022 2009   LABSPEC 1.024 01/04/2022 2009   PHURINE 5.0 01/04/2022 2009   GLUCOSEU NEGATIVE 01/04/2022 2009   HGBUR NEGATIVE 01/04/2022 2009   HGBUR negative 04/27/2007 0837   BILIRUBINUR NEGATIVE 01/04/2022 2009   KETONESUR 5 (A) 01/04/2022 2009   PROTEINUR NEGATIVE 01/04/2022 2009   UROBILINOGEN negative 04/27/2007 0837   NITRITE NEGATIVE 01/04/2022 2009   LEUKOCYTESUR MODERATE (A) 01/04/2022 2009    Radiological Exams on Admission: CT HEAD CODE STROKE WO CONTRAST  Result Date: 08/07/2023 CLINICAL DATA:  Code  stroke.  Slurred speech EXAM: CT ANGIOGRAPHY HEAD AND NECK CT PERFUSION BRAIN TECHNIQUE: Multidetector CT imaging of the head and neck was performed using the standard protocol during bolus administration of intravenous contrast. Multiplanar CT image reconstructions and MIPs were obtained to evaluate the vascular anatomy. Carotid stenosis measurements (when applicable) are obtained utilizing NASCET criteria, using the distal internal carotid diameter as the denominator. Multiphase CT imaging of the brain was performed following IV bolus contrast injection. Subsequent parametric perfusion maps were calculated using RAPID software. RADIATION DOSE REDUCTION: This exam was performed according to the departmental dose-optimization program which includes automated exposure control, adjustment of the mA and/or kV according to patient size and/or use of iterative reconstruction technique. CONTRAST:  OMNIPAQUE IOHEXOL 350 MG/ML SOLN COMPARISON:  CT head and cervical spine 01/04/2022 FINDINGS: CT HEAD FINDINGS Brain: There is no acute intracranial hemorrhage, extra-axial fluid collection, or acute territorial infarct. There is a possible age-indeterminate infarct in the right lentiform nucleus (8-15), new since 2023. There is background parenchymal volume loss with prominence of the ventricular system and extra-axial CSF spaces. Gray-white differentiation is preserved The pituitary and suprasellar region are normal. There is no mass lesion. There is no mass effect or midline shift. Vascular: No hyperdense vessel or unexpected calcification. Skull: Normal. Negative for fracture or focal lesion. Sinuses/Orbits: The patient is status post functional endoscopic sinus surgery. There is layering fluid in the right sphenoid sinus with surrounding hyperostosis consistent with chronic sinusitis. The globes and orbits are unremarkable. Other: The mastoid air cells and middle ear cavities are clear. ASPECTS [facility] Stroke Program  Early CT Score) - Ganglionic level infarction (caudate, lentiform nuclei, internal capsule, insula, M1-M3 cortex): 6 - Supraganglionic infarction (M4-M6 cortex): 3 Total score (0-10 with 10 being normal): 9 IMPRESSION: CTA NECK FINDINGS Aortic arch: The imaged aortic arch is normal. The origins of the major branch vessels are patent. The subclavian arteries are patent to the level imaged. Right carotid system: The right common, internal, and external carotid arteries are patent, without significant stenosis or occlusion. There is no evidence of dissection or aneurysm. Left carotid system: The left common, internal, and external carotid arteries are patent, without significant stenosis or occlusion. There is no evidence of dissection or aneurysm. Vertebral arteries: The vertebral arteries are patent, without significant stenosis or occlusion there is no evidence of dissection or aneurysm. The right vertebral artery is dominant. Skeleton: There is no acute osseous abnormality or suspicious osseous lesion. There is no visible canal hematoma. Other neck: The soft tissues of the neck are unremarkable. Upper chest: The imaged lung  apices are clear. There are prominent upper mediastinal lymph nodes, nonspecific. Review of the MIP images confirms the above findings CTA HEAD FINDINGS Anterior circulation: There is mild plaque in the intracranial ICAs without significant stenosis or occlusion. The bilateral MCAs and ACAS are patent, without proximal high-grade stenosis or occlusion. There is no aneurysm or AVM. Posterior circulation: The bilateral V4 segments are patent. The basilar artery is patent. The major cerebellar arteries appear patent. The bilateral PCAs are patent, without proximal high-grade stenosis or occlusion. Diminutive posterior communicating arteries are identified. There is no aneurysm or AVM. Venous sinuses: Patent. Anatomic variants: None. Review of the MIP images confirms the above findings CT Brain  Perfusion Findings: ASPECTS: 9 CBF (<30%) Volume: 5mL Perfusion (Tmax>6.0s) volume: 8mL Mismatch Volume: 3mL Infarction Location:Left MCA distribution. 1. Suspect small age-indeterminate infarct in the right lentiform nucleus, new since 2023. ASPECTS is 9 2. Patent vasculature of the head and neck with no hemodynamically significant stenosis, occlusion, or dissection. 3. 5 cc infarct core identified in the left centrum semiovale and 8 cc penumbra identified, mismatch 3 cc. The penumbra is identified in the right temporal lobe as well as the left frontal lobe, likely partially artifactual. 4. Chronic sinusitis status post endoscopic sinus surgery with layering fluid in the right sphenoid sinus which can be seen with acute sinusitis in the correct clinical setting. Findings communicated to Dr Amada Jupiter via Loretha Stapler at 11:15 am. Electronically Signed   By: Lesia Hausen M.D.   On: 08/07/2023 11:27   CT ANGIO HEAD NECK W WO CM W PERF (CODE STROKE)  Result Date: 08/07/2023 CLINICAL DATA:  Code stroke.  Slurred speech EXAM: CT ANGIOGRAPHY HEAD AND NECK CT PERFUSION BRAIN TECHNIQUE: Multidetector CT imaging of the head and neck was performed using the standard protocol during bolus administration of intravenous contrast. Multiplanar CT image reconstructions and MIPs were obtained to evaluate the vascular anatomy. Carotid stenosis measurements (when applicable) are obtained utilizing NASCET criteria, using the distal internal carotid diameter as the denominator. Multiphase CT imaging of the brain was performed following IV bolus contrast injection. Subsequent parametric perfusion maps were calculated using RAPID software. RADIATION DOSE REDUCTION: This exam was performed according to the departmental dose-optimization program which includes automated exposure control, adjustment of the mA and/or kV according to patient size and/or use of iterative reconstruction technique. CONTRAST:  OMNIPAQUE IOHEXOL 350 MG/ML SOLN  COMPARISON:  CT head and cervical spine 01/04/2022 FINDINGS: CT HEAD FINDINGS Brain: There is no acute intracranial hemorrhage, extra-axial fluid collection, or acute territorial infarct. There is a possible age-indeterminate infarct in the right lentiform nucleus (8-15), new since 2023. There is background parenchymal volume loss with prominence of the ventricular system and extra-axial CSF spaces. Gray-white differentiation is preserved The pituitary and suprasellar region are normal. There is no mass lesion. There is no mass effect or midline shift. Vascular: No hyperdense vessel or unexpected calcification. Skull: Normal. Negative for fracture or focal lesion. Sinuses/Orbits: The patient is status post functional endoscopic sinus surgery. There is layering fluid in the right sphenoid sinus with surrounding hyperostosis consistent with chronic sinusitis. The globes and orbits are unremarkable. Other: The mastoid air cells and middle ear cavities are clear. ASPECTS [facility] Stroke Program Early CT Score) - Ganglionic level infarction (caudate, lentiform nuclei, internal capsule, insula, M1-M3 cortex): 6 - Supraganglionic infarction (M4-M6 cortex): 3 Total score (0-10 with 10 being normal): 9 IMPRESSION: CTA NECK FINDINGS Aortic arch: The imaged aortic arch is normal. The origins of the major  branch vessels are patent. The subclavian arteries are patent to the level imaged. Right carotid system: The right common, internal, and external carotid arteries are patent, without significant stenosis or occlusion. There is no evidence of dissection or aneurysm. Left carotid system: The left common, internal, and external carotid arteries are patent, without significant stenosis or occlusion. There is no evidence of dissection or aneurysm. Vertebral arteries: The vertebral arteries are patent, without significant stenosis or occlusion there is no evidence of dissection or aneurysm. The right vertebral artery is dominant.  Skeleton: There is no acute osseous abnormality or suspicious osseous lesion. There is no visible canal hematoma. Other neck: The soft tissues of the neck are unremarkable. Upper chest: The imaged lung apices are clear. There are prominent upper mediastinal lymph nodes, nonspecific. Review of the MIP images confirms the above findings CTA HEAD FINDINGS Anterior circulation: There is mild plaque in the intracranial ICAs without significant stenosis or occlusion. The bilateral MCAs and ACAS are patent, without proximal high-grade stenosis or occlusion. There is no aneurysm or AVM. Posterior circulation: The bilateral V4 segments are patent. The basilar artery is patent. The major cerebellar arteries appear patent. The bilateral PCAs are patent, without proximal high-grade stenosis or occlusion. Diminutive posterior communicating arteries are identified. There is no aneurysm or AVM. Venous sinuses: Patent. Anatomic variants: None. Review of the MIP images confirms the above findings CT Brain Perfusion Findings: ASPECTS: 9 CBF (<30%) Volume: 5mL Perfusion (Tmax>6.0s) volume: 8mL Mismatch Volume: 3mL Infarction Location:Left MCA distribution. 1. Suspect small age-indeterminate infarct in the right lentiform nucleus, new since 2023. ASPECTS is 9 2. Patent vasculature of the head and neck with no hemodynamically significant stenosis, occlusion, or dissection. 3. 5 cc infarct core identified in the left centrum semiovale and 8 cc penumbra identified, mismatch 3 cc. The penumbra is identified in the right temporal lobe as well as the left frontal lobe, likely partially artifactual. 4. Chronic sinusitis status post endoscopic sinus surgery with layering fluid in the right sphenoid sinus which can be seen with acute sinusitis in the correct clinical setting. Findings communicated to Dr Amada Jupiter via Loretha Stapler at 11:15 am. Electronically Signed   By: Lesia Hausen M.D.   On: 08/07/2023 11:27    EKG:  Pending  Assessment/Plan Principal Problem:   Stroke [facility]) Active Problems:   Essential hypertension   Asthma   Stroke (cerebrum) (HCC)  (please populate well all problems here in Problem List. (For example, if patient is on BP meds at home and you resume or decide to hold them, it is a problem that needs to be her. Same for CAD, COPD, HLD and so on)  Acute ischemic stroke Left-sided paresis -Right lentiform nucleus age indeterminant stroke, and other possible source of stroke identified on the CTA, MRI pending - allow permissive hypertension -Neurology consultation appreciated -Telemonitoring x 24 hours, echocardiogram, A1C and lipid panel -Start aspirin and Plavix -Speech evaluation PT OT evaluation  HTN -As above -Change long-acting metoprolol to short acting metoprolol 50 mg twice daily  Mild intermittent asthma -Stable, continue ICS and LABA  Chronic schizophrenia -Mentation at baseline  DVT prophylaxis: Lovenox Code Status: Full code Family Communication: None at bedside Disposition Plan: Patient is sick with stroke significant left-sided weakness, recurrent inpatient stroke workup and PT evaluation, expect patient discharged to inpatient rehab, expect more than 2 midnight hospital stay. Consults called: Neurology Admission status: Telemetry admission   Emeline General MD [facility] Pager [phone]  08/07/2023, 1:22 PM

## 2023-08-07 NOTE — Code Documentation (Signed)
CODE STROKE- PHARMACY COMMUNICATION   Time CODE STROKE called/page received:[id number]  Time response to CODE STROKE was made (in person or via phone): 1040  Time Stroke Kit retrieved from Pyxis (only if needed):N/A  Name of Provider/Nurse contacted: Leafy Ro  Past Medical History:  Diagnosis Date   Acute bronchitis    Asthma    Depression    Hypertension    Schizophrenia (HCC)    Prior to Admission medications   Medication Sig Start Date End Date Taking? Authorizing Provider  albuterol (VENTOLIN HFA) 108 (90 Base) MCG/ACT inhaler Inhale 2 puffs into the lungs every 4 (four) hours as needed for cough or congestion. 08/06/21   [provider]  aspirin EC 325 MG tablet Take 1 tablet (325 mg total) by mouth daily. 12/11/21   Juanell Fairly, MD  benztropine (COGENTIN) 1 MG tablet Take 1 mg by mouth 3 (three) times daily. 10/04/21   [provider]  BREO ELLIPTA 200-25 MCG/ACT AEPB Inhale 1 puff into the lungs daily. Patient not taking: Reported on 07/10/2022 10/11/21   [provider]  cholecalciferol (VITAMIN D) 25 MCG (1000 UNIT) tablet Take 2,000 Units by mouth daily.    [provider]  divalproex (DEPAKOTE ER) 500 MG 24 hr tablet Take 2,000 mg by mouth at bedtime. 10/04/21   [provider]  docusate sodium (COLACE) 100 MG capsule Take 1 capsule (100 mg total) by mouth 2 (two) times daily. 12/11/21   Juanell Fairly, MD  fluticasone-salmeterol (ADVAIR) 500-50 MCG/ACT AEPB Inhale 1 puff into the lungs 2 (two) times daily. 06/28/22   [provider]  metoprolol succinate (TOPROL-XL) 100 MG 24 hr tablet Take 1 tablet (100 mg total) by mouth daily. Take with or immediately following a meal. 12/12/21   Alford Highland, MD  Multiple Vitamins-Minerals (MULTIVITAMIN WITH MINERALS) tablet Take 1 tablet by mouth daily.    [provider]  omeprazole (PRILOSEC) 20 MG capsule Take 20 mg by mouth daily. 10/04/21   [provider]   oxyCODONE (OXY IR/ROXICODONE) 5 MG immediate release tablet Take 1-2 tablets (5-10 mg total) by mouth every 4 (four) hours as needed for breakthrough pain ((for MODERATE breakthrough pain)). 12/11/21   Juanell Fairly, MD  paliperidone (INVEGA) 6 MG 24 hr tablet Take 6 mg by mouth at bedtime. 10/04/21   [provider]    Barrie Folk ,PharmD Clinical Pharmacist  08/07/2023  10:51 AM

## 2023-08-07 NOTE — Progress Notes (Signed)
   08/07/23 1100  Spiritual Encounters  Type of Visit Initial;Attempt (pt unavailable)  Referral source Code page  Reason for visit Code  OnCall Visit Yes   Chaplain responded to Code Stroke. Patient at CT and no family present. Chaplain followed up at 1145 and found patient sleeping in room. No family present.

## 2023-08-07 NOTE — Code Documentation (Signed)
Stroke Response Nurse Documentation Code Documentation  [name] [name] is a 57 y.o. male arriving to [facility] via [facility] on 08/07/2023 with past medical hx of schizophrenia, asthma, HTN, former smoker. On aspirin 81 mg daily. Code stroke was activated by EMS.   Patient from [facility] where he was LKW at 10/2 at 2200 and now complaining of slurred speech, ams. Per EMS, patient was seen normal by staff last night. This morning around 0700 patient was found to be lethargic with slurred speech. Facility reports pt has baseline right sided weakness. Pt noted to had left sided weakness on arrival.   Stroke team at the bedside on patient arrival. Labs drawn and patient cleared for CT by Dr. Rosalia Hammers. Patient to CT with team. NIHSS 14, see documentation for details and code stroke times. Patient with decreased LOC, disoriented, left facial droop, bilateral arm weakness, bilateral leg weakness, left limb ataxia, left decreased sensation, and dysarthria  on exam. The following imaging was completed:  CT Head and CTA. Patient is not a candidate for IV Thrombolytic due to outside window, per MD. Patient is not a candidate for IR due to no LVO on imaging, per MD.   Care Plan: every 2 hour NIHSS and vital signs. NPO until swallow screen pass.   Bedside handoff with ED RN [name] [name].    [name] [name]  Stroke Response RN

## 2023-08-07 NOTE — ED Notes (Signed)
Cell contact for legal guardian [phone]

## 2023-08-07 NOTE — Progress Notes (Signed)
MRI came back negative for acute stroke but suspicious lesions for Warnicke's encephalopathy.  Speech evaluation reported to the patient appears to be sedated and not well awake enough for speech evaluation, thus recommended patient n.p.o. for tonight and speech will see patient tomorrow.  Contact patient mother over the phone, mother reported that the patient lives in a group home for his schizophrenia and bipolar disorder, she only visited him a once a month and last visit was about 3 to 4 weeks ago when she found the patient was very weak " so weak that it was hard for him to hold up a soda" but mother was not sure whether the patient has been eating enough calories or nutrients or vitamins.  Mother reported patient does not drink alcohol at all.  Mother denied patient has history of aspiration but does report patient has chronic cough requiring 2 inhalers.  Contact caregiver Bjorn Loser at [phone], who take care of the patient and reported that the patient eats balanced diet " he eats everything" and denied patient drinks alcohol.  And there is no significant weakness as caregivers observation.  Will check B12 folic acid and 7 level.  Discussed from a sleep, will start trial of thiamine IV 3 times daily, and titrate down according to response.  N.p.o. and start IV fluids.

## 2023-08-07 NOTE — ED Triage Notes (Signed)
Pt to ED via EMS from facility with slurred speech, left sided weakness. LKW 2200, sode stroke activated in the field by EMS. Neuro at [facility].   CBG 131 20 g L Arm

## 2023-08-07 NOTE — ED Provider Notes (Signed)
[facility] Provider Note    Event Date/Time   First MD Initiated Contact with Patient 08/07/23 1049     (approximate)   History   Code Stroke   HPI  [name] [name] is a 57 y.o. male presents via EMS from facility with slurred speech left-sided weakness, EMS activated code stroke in the field.  However last known well appears to have been last night     Physical Exam   Triage Vital Signs: ED Triage Vitals  Encounter Vitals Group     BP 08/07/23 1130 (!) 137/102     Systolic BP Percentile --      Diastolic BP Percentile --      Pulse Rate 08/07/23 1130 100     Resp 08/07/23 1130 (!) 25     Temp 08/07/23 1300 97.6 F (36.4 C)     Temp Source 08/07/23 1300 Axillary     SpO2 08/07/23 1130 93 %     Weight --      Height --      Head Circumference --      Peak Flow --      Pain Score --      Pain Loc --      Pain Education --      Exclude from Growth Chart --     Most recent vital signs: Vitals:   08/07/23 1130 08/07/23 1300  BP: (!) 137/102   Pulse: 100   Resp: (!) 25   Temp:  97.6 F (36.4 C)  SpO2: 93%      General: Awake, no distress.  CV:  Good peripheral perfusion.  Resp:  Normal effort.  Abd:  No distention.  Other:  Left-sided weakness, mild, slurred speech noted   ED Results / Procedures / Treatments   Labs (all labs ordered are listed, but only abnormal results are displayed) Labs Reviewed  CBC - Abnormal; Notable for the following components:      Result Value   WBC 11.3 (*)    All other components within normal limits  DIFFERENTIAL - Abnormal; Notable for the following components:   Neutro Abs 9.0 (*)    All other components within normal limits  COMPREHENSIVE METABOLIC PANEL - Abnormal; Notable for the following components:   Creatinine, Ser 1.27 (*)    Albumin 3.3 (*)    All other components within normal limits  CBG MONITORING, ED - Abnormal; Notable for the following components:   Glucose-Capillary  112 (*)    All other components within normal limits  PROTIME-INR  APTT  ETHANOL  HIV ANTIBODY (ROUTINE TESTING W REFLEX)  HEMOGLOBIN A1C  I-STAT CREATININE, ED     EKG     RADIOLOGY CT head concerning for age-indeterminate CVA    PROCEDURES:  Critical Care performed:   Procedures   MEDICATIONS ORDERED IN ED: Medications  oxyCODONE (Oxy IR/ROXICODONE) immediate release tablet 5-10 mg (has no administration in time range)  paliperidone (INVEGA) 24 hr tablet 6 mg (has no administration in time range)  docusate sodium (COLACE) capsule 100 mg (has no administration in time range)  pantoprazole (PROTONIX) EC tablet 40 mg (has no administration in time range)  benztropine (COGENTIN) tablet 1 mg (has no administration in time range)  divalproex (DEPAKOTE ER) 24 hr tablet 2,000 mg (has no administration in time range)  albuterol (PROVENTIL) (2.5 MG/3ML) 0.083% nebulizer solution 3 mL (has no administration in time range)  mometasone-formoterol (DULERA) 200-5 MCG/ACT inhaler 2 puff (has no  administration in time range)   stroke: early stages of recovery book (has no administration in time range)  acetaminophen (TYLENOL) tablet 650 mg (has no administration in time range)    Or  acetaminophen (TYLENOL) 160 MG/5ML solution 650 mg (has no administration in time range)    Or  acetaminophen (TYLENOL) suppository 650 mg (has no administration in time range)  senna-docusate (Senokot-S) tablet 1 tablet (has no administration in time range)  enoxaparin (LOVENOX) injection 55 mg (has no administration in time range)  aspirin EC tablet 325 mg (has no administration in time range)  clopidogrel (PLAVIX) tablet 300 mg (has no administration in time range)  clopidogrel (PLAVIX) tablet 75 mg (has no administration in time range)  hydrALAZINE (APRESOLINE) injection 5 mg (has no administration in time range)  metoprolol tartrate (LOPRESSOR) tablet 50 mg (has no administration in time range)   sodium chloride flush (NS) 0.9 % injection 3 mL (3 mLs Intravenous Given 08/07/23 1141)  iohexol (OMNIPAQUE) 350 MG/ML injection 100 mL (100 mLs Intravenous Contrast Given 08/07/23 1059)     IMPRESSION / MDM / ASSESSMENT AND PLAN / ED COURSE  I reviewed the triage vital signs and the nursing notes. Patient's presentation is most consistent with acute presentation with potential threat to life or bodily function.   Patient presents with left-sided weakness, slurred speech as detailed above, concerning for CVA.  Last known well apparently last night around 10 PM before going to sleep.  Appreciate Dr. Amada Jupiter of neurology seeing the patient, no tPA recommended given outside of the window, recommends admission to the medicine service for MRI stroke workup       FINAL CLINICAL IMPRESSION(S) / ED DIAGNOSES   Final diagnoses:  Cerebrovascular accident (CVA), unspecified mechanism (HCC)     Rx / DC Orders   ED Discharge Orders     None        Note:  This document was prepared using Dragon voice recognition software and may include unintentional dictation errors.   Jene Every, MD 08/07/23 [phone]

## 2023-08-08 ENCOUNTER — Inpatient Hospital Stay
Admit: 2023-08-08 | Discharge: 2023-08-08 | Disposition: A | Discharge status: Home / Self Care | Payer: PPO | Attending: Internal Medicine | Admitting: Internal Medicine

## 2023-08-08 ENCOUNTER — Inpatient Hospital Stay: Admit: 2023-08-08 | Payer: PPO

## 2023-08-08 ENCOUNTER — Inpatient Hospital Stay
Admit: 2023-08-08 | Discharge: 2023-08-08 | Disposition: A | Discharge status: Home / Self Care | Payer: PPO | Attending: Internal Medicine

## 2023-08-08 ENCOUNTER — Inpatient Hospital Stay (HOSPITAL_BASED_OUTPATIENT_CLINIC_OR_DEPARTMENT_OTHER)
Admit: 2023-08-08 | Discharge: 2023-08-08 | Disposition: A | Discharge status: Home / Self Care | Payer: PPO | Attending: Internal Medicine | Admitting: Internal Medicine

## 2023-08-08 DIAGNOSIS — G459 Transient cerebral ischemic attack, unspecified: Secondary | ICD-10-CM

## 2023-08-08 DIAGNOSIS — I6389 Other cerebral infarction: Secondary | ICD-10-CM

## 2023-08-08 DIAGNOSIS — I10 Essential (primary) hypertension: Secondary | ICD-10-CM

## 2023-08-08 DIAGNOSIS — I6300 Cerebral infarction due to thrombosis of unspecified precerebral artery: Secondary | ICD-10-CM | POA: Diagnosis not present

## 2023-08-08 DIAGNOSIS — I639 Cerebral infarction, unspecified: Secondary | ICD-10-CM | POA: Diagnosis not present

## 2023-08-08 LAB — BLOOD GAS, VENOUS
Acid-Base Excess: 5.7 mmol/L — ABNORMAL HIGH (ref 0.0–2.0)
Acid-Base Excess: 5.5 mmol/L — ABNORMAL HIGH (ref 0.0–2.0)
Bicarbonate: 32.1 mmol/L — ABNORMAL HIGH (ref 20.0–28.0)
Bicarbonate: 32.4 mmol/L — ABNORMAL HIGH (ref 20.0–28.0)
O2 Saturation: 87.6 %
O2 Saturation: 64.9 %
Patient temperature: 37
Patient temperature: 37
pCO2, Ven: 53 mm[Hg] (ref 44–60)
pCO2, Ven: 56 mm[Hg] (ref 44–60)
pH, Ven: 7.39 (ref 7.25–7.43)
pH, Ven: 7.37 (ref 7.25–7.43)
pO2, Ven: 58 mm[Hg] — ABNORMAL HIGH (ref 32–45)
pO2, Ven: 41 mm[Hg] (ref 32–45)

## 2023-08-08 LAB — LIPID PANEL
Cholesterol: 142 mg/dL (ref 0–200)
HDL: 34 mg/dL — ABNORMAL LOW (ref >40)
LDL Cholesterol: 87 mg/dL (ref 0–99)
Total CHOL/HDL Ratio: 4.2 {ratio}
Triglycerides: 106 mg/dL (ref <150)
VLDL: 21 mg/dL (ref 0–40)

## 2023-08-08 LAB — ECHOCARDIOGRAM COMPLETE
Area-P 1/2: 4.54 cm2
Height: 73 in
S' Lateral: 2.4 cm
Weight: 3869.51 [oz_av]

## 2023-08-08 LAB — VITAMIN B12: Vitamin B-12: 867 pg/mL (ref 180–914)

## 2023-08-08 LAB — HEMOGLOBIN A1C
Hgb A1c MFr Bld: 5.9 % — ABNORMAL HIGH (ref 4.8–5.6)
Mean Plasma Glucose: 122.63 mg/dL

## 2023-08-08 LAB — HIV ANTIBODY (ROUTINE TESTING W REFLEX): HIV Screen 4th Generation wRfx: NONREACTIVE

## 2023-08-08 LAB — AMMONIA: Ammonia: 30 umol/L (ref 9–35)

## 2023-08-08 LAB — FOLATE: Folate: 13.9 ng/mL (ref >5.9)

## 2023-08-08 MED ORDER — ADULT MULTIVITAMIN W/MINERALS CH
1.0000 | ORAL_TABLET | Freq: Every day | ORAL | Status: DC
  Administered 2023-08-08: 1 via ORAL
  Filled 2023-08-08: qty 1

## 2023-08-08 MED ORDER — THIAMINE HCL 100 MG PO TABS: 100.0000 mg | ORAL_TABLET | Freq: Every day | ORAL | 0 refills | Status: AC

## 2023-08-08 MED ORDER — CLOPIDOGREL BISULFATE 75 MG PO TABS: 75.0000 mg | ORAL_TABLET | Freq: Every day | ORAL | 0 refills | Status: DC

## 2023-08-08 MED ORDER — ASPIRIN 81 MG PO TBEC: 81.0000 mg | DELAYED_RELEASE_TABLET | Freq: Every day | ORAL | 12 refills | Status: DC

## 2023-08-08 NOTE — Hospital Course (Addendum)
Taken from H&P.  [name] [name] is a 57 y.o. male with medical history significant of HTN, HLD, CVA, mild intermittent asthma, schizophrenia, presented with new onset of left-sided weakness.   On presenting to ED, stable vitals, CT head showed suspected small H indeterminant infarct in the right lentiform nucleus, 5 cc infarct code identified in the left centrum semiovale with penumbra identified in the right temporal lobe as well as the left frontal lobe likely partial artificial. CTA showed no significant stenosis occlusions or dissection.   10/4: Vital stable.  MRI brain with no acute intracranial abnormality, Mild T2 hyperintense signal abnormality in the periaqueductal gray matter, which is nonspecific but can be seen in the setting of Wernicke's encephalopathy. Correlate for history of chronic malnutrition.  Lipid panel with HDL of 34 and LDL of 87, folate normal, L24 normal and B1 levels pending.  No history of alcohol use, started on thiamine supplement. Also checking for other nutritional deficiencies like zinc and copper.  Patient with no history of weight loss and eating well.  Discussed with neurology and likely having those findings very nonspecific and they does not think he had any Warneke.  Some of the nutritional labs which include B1, zinc and copper levels are pending and can be followed up by PCP.  Neurology really think he might have a TIA.  Echocardiogram with normal EF and grade 1 diastolic dysfunction.  Neurology is recommending DAPT with aspirin and Plavix for 3 weeks followed by aspirin only.  He will continue with his home statin.  Patient will continue with his home medications and need to have a close follow-up with his providers for further recommendations.

## 2023-08-08 NOTE — Progress Notes (Signed)
*  PRELIMINARY RESULTS* Echocardiogram 2D Echocardiogram has been performed.  [name] [name] 08/08/2023, 1:30 PM

## 2023-08-08 NOTE — Care Management Obs Status (Signed)
MEDICARE OBSERVATION STATUS NOTIFICATION   Patient Details  Name: [name] [name] MRN: [id number] Date of Birth: 1966-10-06   Medicare Observation Status Notification Given:  Yes    Tiyana Galla, LCSW 08/08/2023, 4:00 PM

## 2023-08-08 NOTE — Progress Notes (Signed)
Patient briefly introduced to role of nurse navigator. Current ECHO underway.

## 2023-08-08 NOTE — Progress Notes (Signed)
Patient transferred to group home via wheelchair with group home staff.

## 2023-08-08 NOTE — Plan of Care (Signed)
  Problem: Education: Goal: Knowledge of disease or condition will improve Outcome: Not Progressing Goal: Knowledge of secondary prevention will improve (MUST DOCUMENT ALL) Outcome: Not Progressing Goal: Knowledge of patient specific risk factors will improve (Mark N/A or DELETE if not current risk factor) Outcome: Not Progressing   Problem: Ischemic Stroke/TIA Tissue Perfusion: Goal: Complications of ischemic stroke/TIA will be minimized Outcome: Not Progressing   Problem: Coping: Goal: Will verbalize positive feelings about self Outcome: Not Progressing Goal: Will identify appropriate support needs Outcome: Not Progressing   Problem: Health Behavior/Discharge Planning: Goal: Ability to manage health-related needs will improve Outcome: Not Progressing Goal: Goals will be collaboratively established with patient/family Outcome: Not Progressing   Problem: Self-Care: Goal: Ability to participate in self-care as condition permits will improve Outcome: Not Progressing Goal: Verbalization of feelings and concerns over difficulty with self-care will improve Outcome: Not Progressing Goal: Ability to communicate needs accurately will improve Outcome: Not Progressing   Problem: Nutrition: Goal: Risk of aspiration will decrease Outcome: Not Progressing Goal: Dietary intake will improve Outcome: Not Progressing   Problem: Education: Goal: Knowledge of General Education information will improve Description: Including pain rating scale, medication(s)/side effects and non-pharmacologic comfort measures Outcome: Not Progressing   Problem: Health Behavior/Discharge Planning: Goal: Ability to manage health-related needs will improve Outcome: Not Progressing   Problem: Clinical Measurements: Goal: Ability to maintain clinical measurements within normal limits will improve Outcome: Not Progressing Goal: Will remain free from infection Outcome: Not Progressing Goal: Diagnostic test  results will improve Outcome: Not Progressing Goal: Respiratory complications will improve Outcome: Not Progressing Goal: Cardiovascular complication will be avoided Outcome: Not Progressing   Problem: Activity: Goal: Risk for activity intolerance will decrease Outcome: Not Progressing   Problem: Nutrition: Goal: Adequate nutrition will be maintained Outcome: Not Progressing   Problem: Coping: Goal: Level of anxiety will decrease Outcome: Not Progressing   Problem: Elimination: Goal: Will not experience complications related to bowel motility Outcome: Not Progressing Goal: Will not experience complications related to urinary retention Outcome: Not Progressing   Problem: Pain Managment: Goal: General experience of comfort will improve Outcome: Not Progressing   Problem: Safety: Goal: Ability to remain free from injury will improve Outcome: Not Progressing   Problem: Skin Integrity: Goal: Risk for impaired skin integrity will decrease Outcome: Not Progressing   

## 2023-08-08 NOTE — Discharge Summary (Signed)
Physician Discharge Summary   Patient: [name] [name] MRN: [id number] DOB: 1966/09/07  Admit date:     08/07/2023  Discharge date: 08/08/23  Discharge Physician: Arnetha Courser   PCP: Pcp, No   Recommendations at discharge:  Please obtain CBC and BMP on follow-up Please follow-up results for B1, copper and zinc levels Follow-up with primary care provider within a week Follow-up with outpatient neurology  Discharge Diagnoses: Principal Problem:   Stroke [facility]) Active Problems:   Essential hypertension   Asthma   Stroke (cerebrum) (HCC)   TIA (transient ischemic attack)   Hospital Course: Taken from H&P.  DEMONTAY [name] is a 57 y.o. male with medical history significant of HTN, HLD, CVA, mild intermittent asthma, schizophrenia, presented with new onset of left-sided weakness.   On presenting to ED, stable vitals, CT head showed suspected small H indeterminant infarct in the right lentiform nucleus, 5 cc infarct code identified in the left centrum semiovale with penumbra identified in the right temporal lobe as well as the left frontal lobe likely partial artificial. CTA showed no significant stenosis occlusions or dissection.   10/4: Vital stable.  MRI brain with no acute intracranial abnormality, Mild T2 hyperintense signal abnormality in the periaqueductal gray matter, which is nonspecific but can be seen in the setting of Wernicke's encephalopathy. Correlate for history of chronic malnutrition.  Lipid panel with HDL of 34 and LDL of 87, folate normal, W09 normal and B1 levels pending.  No history of alcohol use, started on thiamine supplement. Also checking for other nutritional deficiencies like zinc and copper.  Patient with no history of weight loss and eating well.  Discussed with neurology and likely having those findings very nonspecific and they does not think he had any Warneke.  Some of the nutritional labs which include B1, zinc and copper levels are pending and can be  followed up by PCP.  Neurology really think he might have a TIA.  Echocardiogram with normal EF and grade 1 diastolic dysfunction.  Neurology is recommending DAPT with aspirin and Plavix for 3 weeks followed by aspirin only.  He will continue with his home statin.  Patient will continue with his home medications and need to have a close follow-up with his providers for further recommendations.     Consultants: Neurology Procedures performed: None Disposition: Group home Diet recommendation:  Discharge Diet Orders (From admission, onward)     Start     Ordered   08/08/23 0000  Diet - low sodium heart healthy        08/08/23 1431           Cardiac diet DISCHARGE MEDICATION: Allergies as of 08/08/2023       Reactions   Cefprozil    Cetirizine Hcl    Doxycycline         Medication List     STOP taking these medications    Breo Ellipta 200-25 MCG/ACT Aepb Generic drug: fluticasone furoate-vilanterol   fluticasone-salmeterol 500-50 MCG/ACT Aepb Commonly known as: ADVAIR   oxyCODONE 5 MG immediate release tablet Commonly known as: Oxy IR/ROXICODONE       TAKE these medications    albuterol 108 (90 Base) MCG/ACT inhaler Commonly known as: VENTOLIN HFA Inhale 2 puffs into the lungs every 6 (six) hours as needed for cough or congestion.   Allergy Relief 10 MG tablet Generic drug: loratadine Take 10 mg by mouth daily.   aspirin EC 81 MG tablet Take 1 tablet (81 mg total) by mouth  daily. Swallow whole. What changed:  medication strength how much to take additional instructions   atorvastatin 20 MG tablet Commonly known as: LIPITOR Take 20 mg by mouth at bedtime.   benztropine 1 MG tablet Commonly known as: COGENTIN Take 1 mg by mouth 3 (three) times daily.   budesonide-formoterol 160-4.5 MCG/ACT inhaler Commonly known as: SYMBICORT Inhale 2 puffs into the lungs in the morning and at bedtime.   cholecalciferol 25 MCG (1000 UNIT) tablet Commonly  known as: VITAMIN D3 Take 2,000 Units by mouth daily.   clopidogrel 75 MG tablet Commonly known as: PLAVIX Take 1 tablet (75 mg total) by mouth daily. Start taking on: August 09, 2023   divalproex 500 MG 24 hr tablet Commonly known as: DEPAKOTE ER Take 1,500 mg by mouth at bedtime.   docusate sodium 100 MG capsule Commonly known as: COLACE Take 1 capsule (100 mg total) by mouth 2 (two) times daily.   fenofibrate 48 MG tablet Commonly known as: TRICOR Take 48 mg by mouth daily.   furosemide 20 MG tablet Commonly known as: LASIX Take 20 mg by mouth daily.   meloxicam 7.5 MG tablet Commonly known as: MOBIC Take 7.5 mg by mouth daily.   metoprolol succinate 100 MG 24 hr tablet Commonly known as: TOPROL-XL Take 1 tablet (100 mg total) by mouth daily. Take with or immediately following a meal.   multivitamin with minerals tablet Take 1 tablet by mouth daily.   omeprazole 20 MG capsule Commonly known as: PRILOSEC Take 20 mg by mouth daily.   paliperidone 6 MG 24 hr tablet Commonly known as: INVEGA Take 6 mg by mouth at bedtime.   thiamine 100 MG tablet Commonly known as: VITAMIN B1 Take 1 tablet (100 mg total) by mouth daily.        Discharge Exam: Filed Weights   08/08/23 0311  Weight: 109.7 kg   General.  Well-developed gentleman, in no acute distress. Pulmonary.  Lungs clear bilaterally, normal respiratory effort. CV.  Regular rate and rhythm, no JVD, rub or murmur. Abdomen.  Soft, nontender, nondistended, BS positive. CNS.  Alert and oriented .  No focal neurologic deficit. Extremities.  No edema, no cyanosis, pulses intact and symmetrical. Psychiatry.  Appears to have some cognitive impairment.   Condition at discharge: stable  The results of significant diagnostics from this hospitalization (including imaging, microbiology, ancillary and laboratory) are listed below for reference.   Imaging Studies: ECHOCARDIOGRAM COMPLETE  Result Date:  08/08/2023    ECHOCARDIOGRAM REPORT   Patient Name:   [name] [name] Date of Exam: 08/08/2023 Medical Rec #:  [id number]        Height:       73.0 in Accession #:    [id number]       Weight:       241.8 lb Date of Birth:  [birth year]        BSA:          2.333 m Patient Age:    57 years         BP:           136/83 mmHg Patient Gender: M                HR:           66 bpm. Exam Location:  [facility] Procedure: 2D Echo, Cardiac Doppler and Color Doppler Indications:     Stroke I63.9  History:         Patient has no  prior history of Echocardiogram examinations.  Sonographer:     Cristela Blue Referring Phys:  [id number] Emeline General Diagnosing Phys: Debbe Odea MD  Sonographer Comments: Technically difficult study due to poor echo windows. Image acquisition challenging due to patient body habitus. IMPRESSIONS  1. Left ventricular ejection fraction, by estimation, is 55 to 60%. The left ventricle has normal function. The left ventricle has no regional wall motion abnormalities. There is mild left ventricular hypertrophy. Left ventricular diastolic parameters are consistent with Grade I diastolic dysfunction (impaired relaxation).  2. Right ventricular systolic function is normal. The right ventricular size is normal.  3. The mitral valve is normal in structure. No evidence of mitral valve regurgitation.  4. The aortic valve was not well visualized. Aortic valve regurgitation is not visualized. FINDINGS  Left Ventricle: Left ventricular ejection fraction, by estimation, is 55 to 60%. The left ventricle has normal function. The left ventricle has no regional wall motion abnormalities. The left ventricular internal cavity size was normal in size. There is  mild left ventricular hypertrophy. Left ventricular diastolic parameters are consistent with Grade I diastolic dysfunction (impaired relaxation). Right Ventricle: The right ventricular size is normal. No increase in right ventricular wall thickness. Right ventricular systolic  function is normal. Left Atrium: Left atrial size was normal in size. Right Atrium: Right atrial size was normal in size. Pericardium: There is no evidence of pericardial effusion. Mitral Valve: The mitral valve is normal in structure. No evidence of mitral valve regurgitation. Tricuspid Valve: The tricuspid valve is normal in structure. Tricuspid valve regurgitation is not demonstrated. Aortic Valve: The aortic valve was not well visualized. Aortic valve regurgitation is not visualized. Pulmonic Valve: The pulmonic valve was normal in structure. Pulmonic valve regurgitation is not visualized. Aorta: The aortic root is normal in size and structure. Venous: The inferior vena cava was not well visualized. IAS/Shunts: No atrial level shunt detected by color flow Doppler.  LEFT VENTRICLE PLAX 2D LVIDd:         3.70 cm   Diastology LVIDs:         2.40 cm   LV e' medial:    8.05 cm/s LV PW:         1.40 cm   LV E/e' medial:  6.0 LV IVS:        1.60 cm   LV e' lateral:   9.36 cm/s LVOT diam:     2.20 cm   LV E/e' lateral: 5.2 LVOT Area:     3.80 cm  RIGHT VENTRICLE RV Basal diam:  3.10 cm RV Mid diam:    3.30 cm LEFT ATRIUM         Index LA diam:    3.30 cm 1.41 cm/m   AORTA Ao Root diam: 3.00 cm MITRAL VALVE               TRICUSPID VALVE MV Area (PHT): 4.54 cm    TR Peak grad:   20.2 mmHg MV Decel Time: 167 msec    TR Vmax:        225.00 cm/s MV E velocity: 48.70 cm/s MV A velocity: 60.30 cm/s  SHUNTS MV E/A ratio:  0.81        Systemic Diam: 2.20 cm Debbe Odea MD Electronically signed by Debbe Odea MD Signature Date/Time: 08/08/2023/1:54:58 PM    Final    DG Chest 1 View  Result Date: 08/07/2023 CLINICAL DATA:  Stroke EXAM: CHEST  1 VIEW COMPARISON:  Radiograph 07/23/2018 FINDINGS: Low  lung volumes accentuate cardiomediastinal silhouette and pulmonary vascularity. Atelectasis or scarring in the left lower lung. No pleural effusion or pneumothorax. No displaced rib fractures. IMPRESSION: Low lung  volumes with atelectasis or scarring in the left lower lung. Electronically Signed   By: Minerva Fester M.D.   On: 08/07/2023 15:42   MR BRAIN WO CONTRAST  Result Date: 08/07/2023 CLINICAL DATA:  Stroke, follow up. EXAM: MRI HEAD WITHOUT CONTRAST TECHNIQUE: Multiplanar, multiecho pulse sequences of the brain and surrounding structures were obtained without intravenous contrast. COMPARISON:  CT Head 08/07/23, CTA head/neck 08/07/23 FINDINGS: Brain: No acute infarction, hemorrhage, hydrocephalus, extra-axial collection or mass lesion. Age advanced volume loss. There is mild T2 hyperintense signal abnormality in the periaqueductal gray matter. Vascular: Normal flow voids. Skull and upper cervical spine: Normal marrow signal. Sinuses/Orbits: Mild mucosal thickening in the bilateral maxillary sinuses. No middle ear or mastoid effusion. Orbits are unremarkable. Other: None. IMPRESSION: 1. No acute intracranial abnormality. 2. Mild T2 hyperintense signal abnormality in the periaqueductal gray matter, which is nonspecific but can be seen in the setting of Wernicke's encephalopathy. Correlate for history of chronic malnutrition. 3. Advanced generalized volume loss Electronically Signed   By: Lorenza Cambridge M.D.   On: 08/07/2023 14:56   CT HEAD CODE STROKE WO CONTRAST  Result Date: 08/07/2023 CLINICAL DATA:  Code stroke.  Slurred speech EXAM: CT ANGIOGRAPHY HEAD AND NECK CT PERFUSION BRAIN TECHNIQUE: Multidetector CT imaging of the head and neck was performed using the standard protocol during bolus administration of intravenous contrast. Multiplanar CT image reconstructions and MIPs were obtained to evaluate the vascular anatomy. Carotid stenosis measurements (when applicable) are obtained utilizing NASCET criteria, using the distal internal carotid diameter as the denominator. Multiphase CT imaging of the brain was performed following IV bolus contrast injection. Subsequent parametric perfusion maps were calculated  using RAPID software. RADIATION DOSE REDUCTION: This exam was performed according to the departmental dose-optimization program which includes automated exposure control, adjustment of the mA and/or kV according to patient size and/or use of iterative reconstruction technique. CONTRAST:  OMNIPAQUE IOHEXOL 350 MG/ML SOLN COMPARISON:  CT head and cervical spine 01/04/2022 FINDINGS: CT HEAD FINDINGS Brain: There is no acute intracranial hemorrhage, extra-axial fluid collection, or acute territorial infarct. There is a possible age-indeterminate infarct in the right lentiform nucleus (8-15), new since 2023. There is background parenchymal volume loss with prominence of the ventricular system and extra-axial CSF spaces. Gray-white differentiation is preserved The pituitary and suprasellar region are normal. There is no mass lesion. There is no mass effect or midline shift. Vascular: No hyperdense vessel or unexpected calcification. Skull: Normal. Negative for fracture or focal lesion. Sinuses/Orbits: The patient is status post functional endoscopic sinus surgery. There is layering fluid in the right sphenoid sinus with surrounding hyperostosis consistent with chronic sinusitis. The globes and orbits are unremarkable. Other: The mastoid air cells and middle ear cavities are clear. ASPECTS [facility] Stroke Program Early CT Score) - Ganglionic level infarction (caudate, lentiform nuclei, internal capsule, insula, M1-M3 cortex): 6 - Supraganglionic infarction (M4-M6 cortex): 3 Total score (0-10 with 10 being normal): 9 IMPRESSION: CTA NECK FINDINGS Aortic arch: The imaged aortic arch is normal. The origins of the major branch vessels are patent. The subclavian arteries are patent to the level imaged. Right carotid system: The right common, internal, and external carotid arteries are patent, without significant stenosis or occlusion. There is no evidence of dissection or aneurysm. Left carotid system: The left common,  internal, and external carotid  arteries are patent, without significant stenosis or occlusion. There is no evidence of dissection or aneurysm. Vertebral arteries: The vertebral arteries are patent, without significant stenosis or occlusion there is no evidence of dissection or aneurysm. The right vertebral artery is dominant. Skeleton: There is no acute osseous abnormality or suspicious osseous lesion. There is no visible canal hematoma. Other neck: The soft tissues of the neck are unremarkable. Upper chest: The imaged lung apices are clear. There are prominent upper mediastinal lymph nodes, nonspecific. Review of the MIP images confirms the above findings CTA HEAD FINDINGS Anterior circulation: There is mild plaque in the intracranial ICAs without significant stenosis or occlusion. The bilateral MCAs and ACAS are patent, without proximal high-grade stenosis or occlusion. There is no aneurysm or AVM. Posterior circulation: The bilateral V4 segments are patent. The basilar artery is patent. The major cerebellar arteries appear patent. The bilateral PCAs are patent, without proximal high-grade stenosis or occlusion. Diminutive posterior communicating arteries are identified. There is no aneurysm or AVM. Venous sinuses: Patent. Anatomic variants: None. Review of the MIP images confirms the above findings CT Brain Perfusion Findings: ASPECTS: 9 CBF (<30%) Volume: 5mL Perfusion (Tmax>6.0s) volume: 8mL Mismatch Volume: 3mL Infarction Location:Left MCA distribution. 1. Suspect small age-indeterminate infarct in the right lentiform nucleus, new since 2023. ASPECTS is 9 2. Patent vasculature of the head and neck with no hemodynamically significant stenosis, occlusion, or dissection. 3. 5 cc infarct core identified in the left centrum semiovale and 8 cc penumbra identified, mismatch 3 cc. The penumbra is identified in the right temporal lobe as well as the left frontal lobe, likely partially artifactual. 4. Chronic sinusitis  status post endoscopic sinus surgery with layering fluid in the right sphenoid sinus which can be seen with acute sinusitis in the correct clinical setting. Findings communicated to Dr Amada Jupiter via Loretha Stapler at 11:15 am. Electronically Signed   By: Lesia Hausen M.D.   On: 08/07/2023 11:27   CT ANGIO HEAD NECK W WO CM W PERF (CODE STROKE)  Result Date: 08/07/2023 CLINICAL DATA:  Code stroke.  Slurred speech EXAM: CT ANGIOGRAPHY HEAD AND NECK CT PERFUSION BRAIN TECHNIQUE: Multidetector CT imaging of the head and neck was performed using the standard protocol during bolus administration of intravenous contrast. Multiplanar CT image reconstructions and MIPs were obtained to evaluate the vascular anatomy. Carotid stenosis measurements (when applicable) are obtained utilizing NASCET criteria, using the distal internal carotid diameter as the denominator. Multiphase CT imaging of the brain was performed following IV bolus contrast injection. Subsequent parametric perfusion maps were calculated using RAPID software. RADIATION DOSE REDUCTION: This exam was performed according to the departmental dose-optimization program which includes automated exposure control, adjustment of the mA and/or kV according to patient size and/or use of iterative reconstruction technique. CONTRAST:  OMNIPAQUE IOHEXOL 350 MG/ML SOLN COMPARISON:  CT head and cervical spine 01/04/2022 FINDINGS: CT HEAD FINDINGS Brain: There is no acute intracranial hemorrhage, extra-axial fluid collection, or acute territorial infarct. There is a possible age-indeterminate infarct in the right lentiform nucleus (8-15), new since 2023. There is background parenchymal volume loss with prominence of the ventricular system and extra-axial CSF spaces. Gray-white differentiation is preserved The pituitary and suprasellar region are normal. There is no mass lesion. There is no mass effect or midline shift. Vascular: No hyperdense vessel or unexpected  calcification. Skull: Normal. Negative for fracture or focal lesion. Sinuses/Orbits: The patient is status post functional endoscopic sinus surgery. There is layering fluid in the right sphenoid sinus with  surrounding hyperostosis consistent with chronic sinusitis. The globes and orbits are unremarkable. Other: The mastoid air cells and middle ear cavities are clear. ASPECTS [facility] Stroke Program Early CT Score) - Ganglionic level infarction (caudate, lentiform nuclei, internal capsule, insula, M1-M3 cortex): 6 - Supraganglionic infarction (M4-M6 cortex): 3 Total score (0-10 with 10 being normal): 9 IMPRESSION: CTA NECK FINDINGS Aortic arch: The imaged aortic arch is normal. The origins of the major branch vessels are patent. The subclavian arteries are patent to the level imaged. Right carotid system: The right common, internal, and external carotid arteries are patent, without significant stenosis or occlusion. There is no evidence of dissection or aneurysm. Left carotid system: The left common, internal, and external carotid arteries are patent, without significant stenosis or occlusion. There is no evidence of dissection or aneurysm. Vertebral arteries: The vertebral arteries are patent, without significant stenosis or occlusion there is no evidence of dissection or aneurysm. The right vertebral artery is dominant. Skeleton: There is no acute osseous abnormality or suspicious osseous lesion. There is no visible canal hematoma. Other neck: The soft tissues of the neck are unremarkable. Upper chest: The imaged lung apices are clear. There are prominent upper mediastinal lymph nodes, nonspecific. Review of the MIP images confirms the above findings CTA HEAD FINDINGS Anterior circulation: There is mild plaque in the intracranial ICAs without significant stenosis or occlusion. The bilateral MCAs and ACAS are patent, without proximal high-grade stenosis or occlusion. There is no aneurysm or AVM. Posterior  circulation: The bilateral V4 segments are patent. The basilar artery is patent. The major cerebellar arteries appear patent. The bilateral PCAs are patent, without proximal high-grade stenosis or occlusion. Diminutive posterior communicating arteries are identified. There is no aneurysm or AVM. Venous sinuses: Patent. Anatomic variants: None. Review of the MIP images confirms the above findings CT Brain Perfusion Findings: ASPECTS: 9 CBF (<30%) Volume: 5mL Perfusion (Tmax>6.0s) volume: 8mL Mismatch Volume: 3mL Infarction Location:Left MCA distribution. 1. Suspect small age-indeterminate infarct in the right lentiform nucleus, new since 2023. ASPECTS is 9 2. Patent vasculature of the head and neck with no hemodynamically significant stenosis, occlusion, or dissection. 3. 5 cc infarct core identified in the left centrum semiovale and 8 cc penumbra identified, mismatch 3 cc. The penumbra is identified in the right temporal lobe as well as the left frontal lobe, likely partially artifactual. 4. Chronic sinusitis status post endoscopic sinus surgery with layering fluid in the right sphenoid sinus which can be seen with acute sinusitis in the correct clinical setting. Findings communicated to Dr Amada Jupiter via Loretha Stapler at 11:15 am. Electronically Signed   By: Lesia Hausen M.D.   On: 08/07/2023 11:27    Microbiology: Results for orders placed or performed during the hospital encounter of 01/05/22  Resp Panel by RT-PCR (Flu A&B, Covid) Nasopharyngeal Swab     Status: None   Collection Time: 01/05/22  2:32 AM   Specimen: Nasopharyngeal Swab; Nasopharyngeal(NP) swabs in vial transport medium  Result Value Ref Range Status   SARS Coronavirus 2 by RT PCR NEGATIVE NEGATIVE Final    Comment: (NOTE) SARS-CoV-2 target nucleic acids are NOT DETECTED.  The SARS-CoV-2 RNA is generally detectable in upper respiratory specimens during the acute phase of infection. The lowest concentration of SARS-CoV-2 viral copies this  assay can detect is 138 copies/mL. A negative result does not preclude SARS-Cov-2 infection and should not be used as the sole basis for treatment or other patient management decisions. A negative result may occur with  improper specimen  collection/handling, submission of specimen other than nasopharyngeal swab, presence of viral mutation(s) within the areas targeted by this assay, and inadequate number of viral copies(<138 copies/mL). A negative result must be combined with clinical observations, patient history, and epidemiological information. The expected result is Negative.  Fact Sheet for Patients:  BloggerCourse.com  Fact Sheet for Healthcare Providers:  SeriousBroker.it  This test is no t yet approved or cleared by the [place] FDA and  has been authorized for detection and/or diagnosis of SARS-CoV-2 by FDA under an Emergency Use Authorization (EUA). This EUA will remain  in effect (meaning this test can be used) for the duration of the COVID-19 declaration under Section 564(b)(1) of the Act, 21 U.S.C.section 360bbb-3(b)(1), unless the authorization is terminated  or revoked sooner.       Influenza A by PCR NEGATIVE NEGATIVE Final   Influenza B by PCR NEGATIVE NEGATIVE Final    Comment: (NOTE) The Xpert Xpress SARS-CoV-2/FLU/RSV plus assay is intended as an aid in the diagnosis of influenza from Nasopharyngeal swab specimens and should not be used as a sole basis for treatment. Nasal washings and aspirates are unacceptable for Xpert Xpress SARS-CoV-2/FLU/RSV testing.  Fact Sheet for Patients: BloggerCourse.com  Fact Sheet for Healthcare Providers: SeriousBroker.it  This test is not yet approved or cleared by the [place] FDA and has been authorized for detection and/or diagnosis of SARS-CoV-2 by FDA under an Emergency Use Authorization (EUA). This EUA will  remain in effect (meaning this test can be used) for the duration of the COVID-19 declaration under Section 564(b)(1) of the Act, 21 U.S.C. section 360bbb-3(b)(1), unless the authorization is terminated or revoked.  Performed at [facility], [address] [place], [place] [address]     Labs: CBC: Recent Labs  Lab 08/07/23 1050  WBC 11.3*  NEUTROABS 9.0*  HGB 14.3  HCT 42.5  MCV 93.6  PLT 201   Basic Metabolic Panel: Recent Labs  Lab 08/07/23 1050  NA 138  K 4.0  CL 103  CO2 27  GLUCOSE 99  BUN 19  CREATININE 1.27*  CALCIUM 9.0   Liver Function Tests: Recent Labs  Lab 08/07/23 1050  AST 35  ALT 24  ALKPHOS 49  BILITOT 0.4  PROT 7.3  ALBUMIN 3.3*   CBG: Recent Labs  Lab 08/07/23 1048  GLUCAP 112*    Discharge time spent: greater than 30 minutes.  This record has been created using Conservation officer, historic buildings. Errors have been sought and corrected,but may not always be located. Such creation errors do not reflect on the standard of care.   Signed: Arnetha Courser, MD [facility] 08/08/2023

## 2023-08-08 NOTE — Evaluation (Signed)
Physical Therapy Evaluation Patient Details Name: [name] [name] MRN: [id number] DOB: [birth year] Today's Date: 08/08/2023  History of Present Illness  Pt is a 57 y.o. male presenting to hospital 08/07/23 with c/o severe dysarthria and new L sided weakness.  Pt admitted for stroke workup.  Per MD note "MRI came back negative for acute stroke but suspicious lesions for Warnicke's encephalopathy".  PMH includes acute bronchitis, asthma, depression, htn, schizophrenia, ORIF R ankle fx 12/07/21.  Clinical Impression  Prior to recent medical concerns, pt reports being modified independent ambulating with SPC; lives at a group home.  Pt oriented to person, hospital, and general situation (not date).  Currently pt is min assist semi-supine to sitting EOB; max assist to stand from bed but CGA to stand from recliner; and CGA to ambulate 120 feet with RW use.  No c/o pain during session.  Pt would currently benefit from skilled PT to address noted impairments and functional limitations (see below for any additional details).  Upon hospital discharge, pt would benefit from ongoing therapy.     If plan is discharge home, recommend the following: A little help with walking and/or transfers;A little help with bathing/dressing/bathroom;Assistance with cooking/housework;Direct supervision/assist for medications management;Direct supervision/assist for financial management;Assist for transportation;Help with stairs or ramp for entrance   Can travel by private vehicle    Yes    Equipment Recommendations Rolling walker (2 wheels)  Recommendations for Other Services       Functional Status Assessment Patient has had a recent decline in their functional status and demonstrates the ability to make significant improvements in function in a reasonable and predictable amount of time.     Precautions / Restrictions Precautions Precautions: Fall Restrictions Weight Bearing Restrictions: No      Mobility  Bed  Mobility Overal bed mobility: Needs Assistance Bed Mobility: Supine to Sit     Supine to sit: Min assist, HOB elevated     General bed mobility comments: assist for trunk    Transfers Overall transfer level: Needs assistance Equipment used: Rolling walker (2 wheels) Transfers: Sit to/from Stand Sit to Stand: Max assist, Contact guard assist           General transfer comment: max assist to stand from bed but CGA to stand from recliner; vc's for UE placement    Ambulation/Gait Ambulation/Gait assistance: Contact guard assist Gait Distance (Feet): 120 Feet Assistive device: Rolling walker (2 wheels) Gait Pattern/deviations: Step-through pattern Gait velocity: decreased     General Gait Details: decreased stance time R LE (pt reports from h/o R ankle fx)  Stairs            Wheelchair Mobility     Tilt Bed    Modified Rankin (Stroke Patients Only)       Balance Overall balance assessment: Needs assistance Sitting-balance support: No upper extremity supported, Feet supported Sitting balance-Leahy Scale: Good Sitting balance - Comments: steady reaching within BOS   Standing balance support: Bilateral upper extremity supported, During functional activity, Reliant on assistive device for balance Standing balance-Leahy Scale: Good Standing balance comment: steady ambulating with RW use                             Pertinent Vitals/Pain Pain Assessment Pain Assessment: No/denies pain Vitals (HR and SpO2 sats on room air) stable and WFL throughout treatment session.    Home Living Family/patient expects to be discharged to:: Group home Living Arrangements:  (5 other  mates within the group home)             Home Layout: One level Home Equipment: Cane - single point;Rollator (4 wheels) Additional Comments: Pt reports level entry group home.    Prior Function Prior Level of Function : Independent/Modified Independent              Mobility Comments: Pt reports modified independent ambulating with SPC qhen outside of home       Extremity/Trunk Assessment   Upper Extremity Assessment Upper Extremity Assessment: Defer to OT evaluation    Lower Extremity Assessment Lower Extremity Assessment: RLE deficits/detail;LLE deficits/detail RLE Deficits / Details: hip flexion 4/5; knee flexion/extension 4/5; DF 4/5 LLE Deficits / Details: hip flexion, knee flexion/extension, and DF 4+/5    Cervical / Trunk Assessment Cervical / Trunk Assessment: Normal  Communication   Communication Communication: Other (comment) (Dysarthria noted (pt initially very difficult to understand but improved during session and easier to understand pt)) Cueing Techniques: Verbal cues  Cognition Arousal: Alert Behavior During Therapy: WFL for tasks assessed/performed Overall Cognitive Status: No family/caregiver present to determine baseline cognitive functioning                                 General Comments: Oriented to person, hospital, and general situation; pt did not know date.        General Comments  Nursing cleared pt for participation in physical therapy.  Pt agreeable to PT session.    Exercises     Assessment/Plan    PT Assessment Patient needs continued PT services  PT Problem List Decreased strength;Decreased activity tolerance;Decreased balance;Decreased mobility;Decreased knowledge of use of DME;Decreased knowledge of precautions       PT Treatment Interventions DME instruction;Gait training;Functional mobility training;Therapeutic activities;Therapeutic exercise;Balance training;Patient/family education    PT Goals (Current goals can be found in the Care Plan section)  Acute Rehab PT Goals Patient Stated Goal: to improve strength and mobility PT Goal Formulation: With patient Time For Goal Achievement: 08/22/23 Potential to Achieve Goals: Good    Frequency Min 1X/week     Co-evaluation                AM-PAC PT "6 Clicks" Mobility  Outcome Measure Help needed turning from your back to your side while in a flat bed without using bedrails?: None Help needed moving from lying on your back to sitting on the side of a flat bed without using bedrails?: A Little Help needed moving to and from a bed to a chair (including a wheelchair)?: A Little Help needed standing up from a chair using your arms (e.g., wheelchair or bedside chair)?: A Little Help needed to walk in hospital room?: A Little Help needed climbing 3-5 steps with a railing? : A Little 6 Click Score: 19    End of Session Equipment Utilized During Treatment: Gait belt Activity Tolerance: Patient tolerated treatment well Patient left: in chair;with call bell/phone within reach;with chair alarm set Nurse Communication: Mobility status;Precautions PT Visit Diagnosis: Other abnormalities of gait and mobility (R26.89);Muscle weakness (generalized) (M62.81)    Time: 1610-9604 PT Time Calculation (min) (ACUTE ONLY): 27 min   Charges:   PT Evaluation $PT Eval Low Complexity: 1 Low PT Treatments $Gait Training: 8-22 mins PT General Charges $$ ACUTE PT VISIT: 1 Visit        Hendricks Limes, PT 08/08/23, 10:01 AM

## 2023-08-08 NOTE — Progress Notes (Signed)
*  PRELIMINARY RESULTS* Echocardiogram 2D Echocardiogram has been performed.  [name] [name] 08/08/2023, 1:42 PM

## 2023-08-08 NOTE — [place] FL2 (Signed)
[place] MEDICAID FL2 LEVEL OF CARE FORM     IDENTIFICATION  Patient Name: [name] [name] Birthdate: Jan 04, 1966 Sex: male Admission Date (Current Location): 08/07/2023  [facility] [place] Number:  Chiropodist and Address:  [facility], 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   Stroke (HCC) 08/07/2023   Snoring 07/10/2022   Unwitnessed fall    Valproic acid toxicity    Altered mental status    Obesity (BMI 30-39.9) 12/10/2021   Ankle fracture 12/08/2021   Dehydration 12/08/2021   Impaired fasting glucose 12/07/2021   Closed trimalleolar fracture of right ankle 12/06/2021   ACUTE BRONCHITIS 01/24/2009   SCHIZOPHRENIA, PARANOID, CHRONIC 04/27/2007   Essential hypertension 04/23/2007   Asthma 04/23/2007    Orientation RESPIRATION BLADDER Height & Weight     Self, Place  Normal Incontinent Weight: 241 lb 13.5 oz (109.7 kg) Height:  6\' 1"  (185.4 cm)  BEHAVIORAL SYMPTOMS/MOOD NEUROLOGICAL BOWEL NUTRITION STATUS           AMBULATORY STATUS COMMUNICATION OF NEEDS Skin   Limited Assist   Normal                       Personal Care Assistance Level of Assistance  Bathing, Feeding Bathing Assistance: Limited assistance Feeding assistance: Limited assistance       Functional Limitations Info             SPECIAL CARE FACTORS FREQUENCY  PT (By licensed PT), OT (By licensed OT)     PT Frequency: 3 times a week OT Frequency: 3 times a  week            Contractures Contractures Info: Not present    Additional Factors Info  Code Status, Allergies Code Status Info: FULL Allergies Info: Cefprozil  Cetirizine Hcl  Doxycycline           Current Medications (08/08/2023):  This is the current hospital active medication list Current Facility-Administered Medications  Medication Dose Route Frequency Provider Last Rate Last Admin    stroke: early stages of recovery book   Does not apply Once Mikey College T, MD       acetaminophen (TYLENOL) tablet 650 mg  650 mg Oral Q4H PRN Emeline General, MD       Or   acetaminophen (TYLENOL) 160 MG/5ML solution 650 mg  650 mg Per Tube Q4H PRN Mikey College T, MD       Or   acetaminophen (TYLENOL) suppository 650 mg  650 mg Rectal Q4H PRN Mikey College T, MD       albuterol (PROVENTIL) (2.5 MG/3ML) 0.083% nebulizer solution 3 mL  3 mL Nebulization Q4H PRN Mikey College T, MD       aspirin EC tablet 325 mg  325 mg Oral Daily Mikey College T, MD   325 mg at 08/08/23 1002   benztropine (COGENTIN) tablet 1 mg  1 mg Oral  TID Mikey College T, MD   1 mg at 08/08/23 1002   clopidogrel (PLAVIX) tablet 300 mg  300 mg Oral Once Mikey College T, MD       clopidogrel (PLAVIX) tablet 75 mg  75 mg Oral Daily Mikey College T, MD   75 mg at 08/08/23 1002   divalproex (DEPAKOTE ER) 24 hr tablet 2,000 mg  2,000 mg Oral QHS Mikey College T, MD       docusate sodium (COLACE) capsule 100 mg  100 mg Oral BID Mikey College T, MD   100 mg at 08/08/23 1002   enoxaparin (LOVENOX) injection 55 mg  55 mg Subcutaneous Q24H Mikey College T, MD   55 mg at 08/07/23 2114   hydrALAZINE (APRESOLINE) injection 5 mg  5 mg Intravenous Q6H PRN Mikey College T, MD       metoprolol tartrate (LOPRESSOR) tablet 50 mg  50 mg Oral BID Mikey College T, MD   50 mg at 08/08/23 1002   mometasone-formoterol (DULERA) 200-5 MCG/ACT inhaler 2 puff  2 puff Inhalation BID Mikey College T, MD   2 puff at 08/08/23 1126   multivitamin with minerals tablet 1 tablet  1  tablet Oral Daily Arnetha Courser, MD   1 tablet at 08/08/23 1002   oxyCODONE (Oxy IR/ROXICODONE) immediate release tablet 5-10 mg  5-10 mg Oral Q4H PRN Mikey College T, MD       paliperidone (INVEGA) 24 hr tablet 6 mg  6 mg Oral QHS Mikey College T, MD       pantoprazole (PROTONIX) EC tablet 40 mg  40 mg Oral Daily Mikey College T, MD   40 mg at 08/08/23 1002   senna-docusate (Senokot-S) tablet 1 tablet  1 tablet Oral QHS PRN Emeline General, MD         Discharge Medications: Please see discharge summary for a list of discharge medications.   STOP taking these medications     Breo Ellipta 200-25 MCG/ACT Aepb Generic drug: fluticasone furoate-vilanterol    fluticasone-salmeterol 500-50 MCG/ACT Aepb Commonly known as: ADVAIR    oxyCODONE 5 MG immediate release tablet Commonly known as: Oxy IR/ROXICODONE           TAKE these medications     albuterol 108 (90 Base) MCG/ACT inhaler Commonly known as: VENTOLIN HFA Inhale 2 puffs into the lungs every 6 (six) hours as needed for cough or congestion.    Allergy Relief 10 MG tablet Generic drug: loratadine Take 10 mg by mouth daily.    aspirin EC 81 MG tablet Take 1 tablet (81 mg total) by mouth daily. Swallow whole. What changed:  medication strength how much to take additional instructions    atorvastatin 20 MG tablet Commonly known as: LIPITOR Take 20 mg by mouth at bedtime.    benztropine 1 MG tablet Commonly known as: COGENTIN Take 1 mg by mouth 3 (three) times daily.    budesonide-formoterol 160-4.5 MCG/ACT inhaler Commonly known as: SYMBICORT Inhale 2 puffs into the lungs in the morning and at bedtime.    cholecalciferol 25 MCG (1000 UNIT) tablet Commonly known as: VITAMIN D3 Take 2,000 Units by mouth daily.    clopidogrel 75 MG tablet Commonly known as: PLAVIX Take 1 tablet (75 mg total) by mouth daily. Start taking on: August 09, 2023    divalproex 500 MG 24 hr tablet Commonly known as: DEPAKOTE ER Take 1,500  mg by mouth at bedtime.    docusate sodium 100 MG capsule Commonly known  as: COLACE Take 1 capsule (100 mg total) by mouth 2 (two) times daily.    fenofibrate 48 MG tablet Commonly known as: TRICOR Take 48 mg by mouth daily.    furosemide 20 MG tablet Commonly known as: LASIX Take 20 mg by mouth daily.    meloxicam 7.5 MG tablet Commonly known as: MOBIC Take 7.5 mg by mouth daily.    metoprolol succinate 100 MG 24 hr tablet Commonly known as: TOPROL-XL Take 1 tablet (100 mg total) by mouth daily. Take with or immediately following a meal.    multivitamin with minerals tablet Take 1 tablet by mouth daily.    omeprazole 20 MG capsule Commonly known as: PRILOSEC Take 20 mg by mouth daily.    paliperidone 6 MG 24 hr tablet Commonly known as: INVEGA Take 6 mg by mouth at bedtime.    thiamine 100 MG tablet Commonly known as: VITAMIN B1 Take 1 tablet (100 mg total) by mouth daily.         Relevant Imaging Results:  Relevant Lab Results:   Additional Information    Community education officer, LCSW

## 2023-08-08 NOTE — TOC Transition Note (Addendum)
Transition of Care [facility]) - CM/SW Discharge Note   Patient Details  Name: CLOYD RAGAS MRN: [id number] Date of Birth: 07-Nov-1965  [facility]) CM/SW Contact:  Allena Katz, LCSW Phone Number: 08/08/2023, 3:13 PM   Clinical Narrative:   Pt discharging back to family care home. CSW spoke with rhonda at family care home [phone] who reports they will take him back today. DC summary and fl2 to be faxed over to facility. Guardian notified pt to be moved today. Facilty requested to use [facility]. Coralee [facility] with [facility] accepted.     Confirmed with guardian patients PCP is wendy schokes out of [place].     Patient Goals and CMS Choice      Discharge Placement                         Discharge Plan and Services Additional resources added to the After Visit Summary for                                       Social Determinants of Health (SDOH) Interventions SDOH Screenings   Tobacco Use: Medium Risk (07/10/2022)     Readmission Risk Interventions     No data to display

## 2023-08-08 NOTE — Progress Notes (Signed)
Subjective: Markedly improved.  He denies weight loss and is very similar weight to what was documented in September 2023.  Exam: Vitals:   08/08/23 0401 08/08/23 0731  BP: (!) 121/105 136/83  Pulse: 78 66  Resp: 16 19  Temp: 97.7 F (36.5 C) (!) 97 F (36.1 C)  SpO2: 96% 95%   Gen: In bed, NAD Resp: non-labored breathing, no acute distress Abd: soft, nt  Neuro: MS: Awake, alert, knows that he is in the hospital but states "I am not from around here so I do not know the name of it."  He gives the month is September (just changed to October) and the year is 2024 CN: EOMI, visual fields full, very mild left facial weakness, he is markedly less dysarthric than yesterday Motor: He has very mild left arm and leg weakness, which she states is at his baseline, 4+/5 Sensory: Intact to light touch  Pertinent Labs: Creatinine 1.27  Impression: 57 year old male with a history of hypertension who presented with severe dysarthria and bulbar dysfunction, and left-sided weakness that improved over the course of the day.  MRI did not show acute infarct, but did show some T2 change in the gray matter which is nonspecific, but the report did mention Wernicke's is one possible etiology.  This is a nonspecific finding, and without significant weight loss or other reasons to suspect malnutrition, I think that a very atypical presentation of a fairly uncommon disorder would be unlikely in this case.  More likely would be a typical presentation of a common disease for which he has risk factors (TIA).  I would favor treating this as TIA.  Recommendations: 1) aspirin 81 mg daily and Plavix 75 mg daily for 3 weeks followed by aspirin 81 mg daily 2) increase statin therapy to atorvastatin 40 mg daily 3) echocardiogram, telemetry 4) could continue oral thiamine 100 mg daily, though I do not feel particular strongly about this it is also very low risk intervention 5) neurology will follow-up  echocardiogram results, otherwise no further testing at this time.  Ritta Slot, MD [facility] [phone]  If 7pm- 7am, please page neurology on call as listed in AMION.

## 2023-08-08 NOTE — Evaluation (Signed)
Occupational Therapy Evaluation Patient Details Name: [name] [name] MRN: [id number] DOB: [birth year] Today's Date: 08/08/2023   History of Present Illness Pt is a 57 y.o. male presenting to hospital 08/07/23 with c/o severe dysarthria and new L sided weakness.  Pt admitted for stroke workup.  Per MD note "MRI came back negative for acute stroke but suspicious lesions for Warnicke's encephalopathy".  PMH includes acute bronchitis, asthma, depression, htn, schizophrenia, ORIF R ankle fx 12/07/21.   Clinical Impression   Pt was seen for OT evaluation this date. Pt was sitting in recliner during arrival finish swallow study with SLP. Pt was agreeable to OT eval. Pt is oriented x4, increased time for processing throughout. PTA Pt reports he was supervision- MOD I with all ADLs, and receives assistance for IADLs (medication and cooking), amb with PRN SPC. Pt lives in [facility] where he lives along side 5 other individuals. Pt presents to acute OT demonstrating impaired ADL performance and functional mobility with stroke like symptoms (See OT problem list for additional functional deficits). Pt currently requires CGA + intermittent verbal cues for all mobility and transfers for additional safety with IV. Pt completed a toilet t/f, amb to BR with CGA + verbal cues ~ 41ft. Pt able to complete LB dressing task, donning/doffing socks with supervision from a seated position. Pt would benefit from skilled OT services to address noted impairments and functional limitations (see below for any additional details) in order to maximize safety and independence while minimizing falls risk and caregiver burden. OT will follow acutely.     If plan is discharge home, recommend the following: A little help with walking and/or transfers;A little help with bathing/dressing/bathroom;Supervision due to cognitive status;Assist for transportation;Assistance with cooking/housework;Direct supervision/assist for financial  management;Direct supervision/assist for medications management    Functional Status Assessment  Patient has had a recent decline in their functional status and demonstrates the ability to make significant improvements in function in a reasonable and predictable amount of time.  Equipment Recommendations  None recommended by OT    Recommendations for Other Services       Precautions / Restrictions Precautions Precautions: Fall Restrictions Weight Bearing Restrictions: No      Mobility Bed Mobility               General bed mobility comments: NT pt in recliner per/post session    Transfers Overall transfer level: Needs assistance Equipment used: Rolling walker (2 wheels) Transfers: Sit to/from Stand (from recliner) Sit to Stand: Contact guard assist                  Balance Overall balance assessment: Needs assistance Sitting-balance support: No upper extremity supported, Feet supported Sitting balance-Leahy Scale: Good Sitting balance - Comments: steady reaching within BOS while eating   Standing balance support: Bilateral upper extremity supported, During functional activity, Reliant on assistive device for balance Standing balance-Leahy Scale: Good Standing balance comment: steady ambulating with RW use                           ADL either performed or assessed with clinical judgement   ADL Overall ADL's : Needs assistance/impaired Eating/Feeding: Supervision/ safety   Grooming: Wash/dry face;Sitting;Set up               Lower Body Dressing: Supervision/safety Lower Body Dressing Details (indicate cue type and reason): Don/dof socks sitting position Toilet Transfer: Rolling walker (2 wheels);Regular Toilet;Contact guard assist;Ambulation Statistician  Details (indicate cue type and reason): Practiced toilet t/f, pt didn't need to use the restroom         Functional mobility during ADLs: Contact guard assist;Rolling walker (2  wheels) approx 25'        Vision   Additional Comments: NT     Perception Perception: Within Functional Limits       Praxis Praxis: WFL       Pertinent Vitals/Pain Pain Assessment Pain Assessment: No/denies pain     Extremity/Trunk Assessment Upper Extremity Assessment Upper Extremity Assessment: Overall WFL for tasks assessed   Lower Extremity Assessment Lower Extremity Assessment: Defer to PT evaluation   Cervical / Trunk Assessment Cervical / Trunk Assessment: Normal   Communication Communication Communication: Difficulty communicating thoughts/reduced clarity of speech;Difficulty following commands/understanding Following commands: Follows one step commands consistently Cueing Techniques: Verbal cues;Tactile cues   Cognition Arousal: Alert Behavior During Therapy: WFL for tasks assessed/performed Overall Cognitive Status: No family/caregiver present to determine baseline cognitive functioning Area of Impairment: Attention, Following commands, Safety/judgement, Awareness                   Current Attention Level: Selective   Following Commands: Follows one step commands consistently Safety/Judgement: Decreased awareness of safety, Decreased awareness of deficits Awareness: Emergent Problem Solving: Slow processing, Requires verbal cues, Requires tactile cues General Comments: Pt cooperative and calm during session     General Comments       Exercises Other Exercises Other Exercises: Edu: role of OT, safe ADL completion, fall prevention   Shoulder Instructions      Home Living                                          Prior Functioning/Environment                          OT Problem List: Decreased knowledge of precautions;Decreased coordination;Decreased activity tolerance;Decreased safety awareness      OT Treatment/Interventions: Self-care/ADL training;Balance training;DME and/or AE instruction;Therapeutic  exercise    OT Goals(Current goals can be found in the care plan section) Acute Rehab OT Goals Patient Stated Goal: to return to group home OT Goal Formulation: With patient Time For Goal Achievement: 08/22/23 Potential to Achieve Goals: Good  OT Frequency: Min 1X/week    Co-evaluation              AM-PAC OT "6 Clicks" Daily Activity     Outcome Measure Help from another person eating meals?: A Little Help from another person taking care of personal grooming?: A Little Help from another person toileting, which includes using toliet, bedpan, or urinal?: None Help from another person bathing (including washing, rinsing, drying)?: A Little Help from another person to put on and taking off regular upper body clothing?: None Help from another person to put on and taking off regular lower body clothing?: None 6 Click Score: 21   End of Session Equipment Utilized During Treatment: Gait belt;Rolling walker (2 wheels) Nurse Communication: Mobility status  Activity Tolerance: Patient tolerated treatment well Patient left: in chair;with call bell/phone within reach;with chair alarm set  OT Visit Diagnosis: Unsteadiness on feet (R26.81);Repeated falls (R29.6);Muscle weakness (generalized) (M62.81);Cognitive communication deficit (R41.841)                Time: 1610-9604 OT Time Calculation (min): 30 min Charges:  OT  General Charges $OT Visit: 1 Visit OT Evaluation $OT Eval Moderate Complexity: 1 Mod  [employer], OTS

## 2023-08-08 NOTE — Care Management CC44 (Signed)
Condition Code 44 Documentation Completed  Patient Details  Name: TATEM FESLER MRN: [id number] Date of Birth: 07/01/1966   Condition Code 44 given:  Yes Patient signature on Condition Code 44 notice:  Yes Documentation of 2 MD's agreement:  Yes Code 44 added to claim:  Yes    Allena Katz, LCSW 08/08/2023, 4:00 PM

## 2023-08-08 NOTE — ED Notes (Signed)
Marland Kitchen NP via secure chat about pt's NPO status. Performed bedside swallow study, but NPO status can not be changed until speech reevaluates the pt.

## 2023-08-08 NOTE — Progress Notes (Signed)
Speech Language Pathology Treatment: Dysphagia  Patient Details Name: [name] [name] MRN: [id number] DOB: [birth year] Today's Date: 08/08/2023 Time: 8469-6295 SLP Time Calculation (min) (ACUTE ONLY): 35 min  Assessment / Plan / Recommendation Clinical Impression  Pt seen for ongoing assessment of swallowing and for po trials to assess appropriateness to initiate an oral diet. Pt exhibited a Much improved State/alertness. He was alert, verbally responsive and engaged in basic conversation w/ SLP; answered basic questions. Orientation fair. Pt does have a Baseline dx of mental disorder per chart notes.  On RA; wbc wnl. Dentures in place.  Pt explained general aspiration precautions and agreed verbally to the need for following them especially sitting upright for all oral intake, taking small bites and sips Slowly. Pt performed teeth brushing w/ OT then fed himself po trials of purees and soft solids and thin liquids via Cup. NO overt clinical s/s of aspiration were noted w/ any consistency; respiratory status remained calm and unlabored, vocal quality clear b/t trials, and no cough. Oral phase appeared [facility] for bolus management and timely A-P transfer for swallowing; mastication appropriate w/ solids and oral clearing achieved w/ all consistencies. Pt followed instructions to moisten foods and take small bites/sips during intake given min verbal cue. OM exam appeared [facility] w/ no lingual or labial weakness noted. Noted a slight open mouth/relaxed oral posture at rest. Speech intelligible in familiar context.    Pt appears at reduced risk for aspriation when following general aspiration precautions. Much improved State and alertness today vs yesterday at [facility]. Recommend a Regular diet w/ gravies added to moisten foods; Thin liquids via Cup -- pt does NOT use straws per his report. Recommend general aspiration precautions; Pills Whole in Puree; tray setup and positioning assistance for meals -- chair best for  eating meals.  No further acute skilled ST services indicated; will sign off at this time w/ MD to reconsult if needed while admitted. NSG updated. Precautions posted at bedside.       HPI HPI: Pt is a 57 y.o. male with a history of Schizophrenia, Bipolar dis., asthma, Obesity, Sleep Apnea, mental dis. per chart, depression, htn, hld, Falls, and previous CVA.  He was last normal last night, and then on awakening this morning, he was found to have severely slurred speech, lethargy, and left sided weakness, which is new.  He resides at [facility] and at baseline is able to perform some ADLs.   MRI: No acute intracranial abnormality.  2. Mild T2 hyperintense signal abnormality in the periaqueductal  gray matter, which is nonspecific but can be seen in the setting of  Wernicke's encephalopathy. Correlate for history of chronic  malnutrition.  3. Advanced generalized volume loss.   CXR: Low lung volumes with atelectasis or scarring in the left lower lung.      SLP Plan  All goals met      Recommendations for follow up therapy are one component of a multi-disciplinary discharge planning process, led by the attending physician.  Recommendations may be updated based on patient status, additional functional criteria and insurance authorization.    Recommendations  Diet recommendations: Regular;Thin liquid (cut meats) Liquids provided via: Cup;No straw (baseline) Medication Administration: Whole meds with puree Supervision: Patient able to self feed;Intermittent supervision to cue for compensatory strategies (setup) Compensations: Minimize environmental distractions;Slow rate;Small sips/bites;Follow solids with liquid Postural Changes and/or Swallow Maneuvers: Out of bed for meals;Seated upright 90 degrees;Upright 30-60 min after meal  Rehab consult (as needed) Oral care BID;Patient independent with oral care (supervision)   Set up Supervision/Assistance Dysphagia,  unspecified (R13.10)     All goals met       Jerilynn Som, MS, CCC-SLP Speech Language Pathologist Rehab Services; [facility] [phone] (ascom) Lurleen Soltero  08/08/2023, 12:53 PM

## 2023-08-11 DIAGNOSIS — F319 Bipolar disorder, unspecified: Secondary | ICD-10-CM | POA: Diagnosis not present

## 2023-08-11 DIAGNOSIS — R2681 Unsteadiness on feet: Secondary | ICD-10-CM | POA: Diagnosis not present

## 2023-08-11 DIAGNOSIS — F99 Mental disorder, not otherwise specified: Secondary | ICD-10-CM | POA: Diagnosis not present

## 2023-08-11 DIAGNOSIS — M6281 Muscle weakness (generalized): Secondary | ICD-10-CM | POA: Diagnosis not present

## 2023-08-11 DIAGNOSIS — I69354 Hemiplegia and hemiparesis following cerebral infarction affecting left non-dominant side: Secondary | ICD-10-CM | POA: Diagnosis not present

## 2023-08-11 DIAGNOSIS — F0390 Unspecified dementia without behavioral disturbance: Secondary | ICD-10-CM | POA: Diagnosis not present

## 2023-08-11 DIAGNOSIS — I10 Essential (primary) hypertension: Secondary | ICD-10-CM | POA: Diagnosis not present

## 2023-08-11 DIAGNOSIS — J45909 Unspecified asthma, uncomplicated: Secondary | ICD-10-CM | POA: Diagnosis not present

## 2023-08-11 DIAGNOSIS — E785 Hyperlipidemia, unspecified: Secondary | ICD-10-CM | POA: Diagnosis not present

## 2023-08-11 DIAGNOSIS — F209 Schizophrenia, unspecified: Secondary | ICD-10-CM | POA: Diagnosis not present

## 2023-08-12 LAB — VITAMIN B1: Vitamin B1 (Thiamine): 359.1 nmol/L — ABNORMAL HIGH (ref 66.5–200.0)

## 2023-08-12 LAB — ZINC: Zinc: 62 ug/dL (ref 44–115)

## 2023-08-12 LAB — COPPER, SERUM: Copper: 85 ug/dL (ref 69–132)

## 2023-08-15 ENCOUNTER — Telehealth: Payer: Self-pay

## 2023-08-15 DIAGNOSIS — J4540 Moderate persistent asthma, uncomplicated: Secondary | ICD-10-CM | POA: Diagnosis not present

## 2023-08-15 DIAGNOSIS — I10 Essential (primary) hypertension: Secondary | ICD-10-CM | POA: Diagnosis not present

## 2023-08-15 DIAGNOSIS — F209 Schizophrenia, unspecified: Secondary | ICD-10-CM | POA: Diagnosis not present

## 2023-09-22 DIAGNOSIS — I10 Essential (primary) hypertension: Secondary | ICD-10-CM | POA: Diagnosis not present

## 2023-09-22 DIAGNOSIS — E785 Hyperlipidemia, unspecified: Secondary | ICD-10-CM | POA: Diagnosis not present

## 2023-09-22 DIAGNOSIS — E559 Vitamin D deficiency, unspecified: Secondary | ICD-10-CM | POA: Diagnosis not present

## 2023-09-22 DIAGNOSIS — R7303 Prediabetes: Secondary | ICD-10-CM | POA: Diagnosis not present

## 2023-09-24 DIAGNOSIS — F319 Bipolar disorder, unspecified: Secondary | ICD-10-CM | POA: Diagnosis not present

## 2023-09-24 DIAGNOSIS — I10 Essential (primary) hypertension: Secondary | ICD-10-CM | POA: Diagnosis not present

## 2023-09-24 DIAGNOSIS — E559 Vitamin D deficiency, unspecified: Secondary | ICD-10-CM | POA: Diagnosis not present

## 2023-09-24 DIAGNOSIS — E785 Hyperlipidemia, unspecified: Secondary | ICD-10-CM | POA: Diagnosis not present

## 2023-10-22 DIAGNOSIS — F209 Schizophrenia, unspecified: Secondary | ICD-10-CM | POA: Diagnosis not present

## 2023-10-22 DIAGNOSIS — R4189 Other symptoms and signs involving cognitive functions and awareness: Secondary | ICD-10-CM | POA: Diagnosis not present

## 2023-10-22 DIAGNOSIS — F819 Developmental disorder of scholastic skills, unspecified: Secondary | ICD-10-CM | POA: Diagnosis not present

## 2023-11-06 DIAGNOSIS — Z79899 Other long term (current) drug therapy: Secondary | ICD-10-CM | POA: Diagnosis not present

## 2023-12-03 DIAGNOSIS — R2689 Other abnormalities of gait and mobility: Secondary | ICD-10-CM | POA: Diagnosis not present

## 2023-12-03 DIAGNOSIS — R9089 Other abnormal findings on diagnostic imaging of central nervous system: Secondary | ICD-10-CM | POA: Diagnosis not present

## 2023-12-03 DIAGNOSIS — I639 Cerebral infarction, unspecified: Secondary | ICD-10-CM | POA: Diagnosis not present

## 2023-12-03 DIAGNOSIS — R269 Unspecified abnormalities of gait and mobility: Secondary | ICD-10-CM | POA: Diagnosis not present

## 2023-12-20 DIAGNOSIS — K219 Gastro-esophageal reflux disease without esophagitis: Secondary | ICD-10-CM | POA: Diagnosis not present

## 2023-12-20 DIAGNOSIS — G8194 Hemiplegia, unspecified affecting left nondominant side: Secondary | ICD-10-CM | POA: Diagnosis not present

## 2023-12-20 DIAGNOSIS — I10 Essential (primary) hypertension: Secondary | ICD-10-CM | POA: Diagnosis not present

## 2023-12-20 DIAGNOSIS — F319 Bipolar disorder, unspecified: Secondary | ICD-10-CM | POA: Diagnosis not present

## 2023-12-20 DIAGNOSIS — E559 Vitamin D deficiency, unspecified: Secondary | ICD-10-CM | POA: Diagnosis not present

## 2023-12-20 DIAGNOSIS — E785 Hyperlipidemia, unspecified: Secondary | ICD-10-CM | POA: Diagnosis not present

## 2023-12-20 DIAGNOSIS — R2681 Unsteadiness on feet: Secondary | ICD-10-CM | POA: Diagnosis not present

## 2023-12-20 DIAGNOSIS — F209 Schizophrenia, unspecified: Secondary | ICD-10-CM | POA: Diagnosis not present

## 2023-12-20 DIAGNOSIS — R053 Chronic cough: Secondary | ICD-10-CM | POA: Diagnosis not present

## 2023-12-20 DIAGNOSIS — M6281 Muscle weakness (generalized): Secondary | ICD-10-CM | POA: Diagnosis not present

## 2023-12-20 DIAGNOSIS — J4540 Moderate persistent asthma, uncomplicated: Secondary | ICD-10-CM | POA: Diagnosis not present

## 2023-12-29 DIAGNOSIS — R7303 Prediabetes: Secondary | ICD-10-CM | POA: Diagnosis not present

## 2024-01-01 DIAGNOSIS — I10 Essential (primary) hypertension: Secondary | ICD-10-CM | POA: Diagnosis not present

## 2024-01-01 DIAGNOSIS — E559 Vitamin D deficiency, unspecified: Secondary | ICD-10-CM | POA: Diagnosis not present

## 2024-01-01 DIAGNOSIS — F319 Bipolar disorder, unspecified: Secondary | ICD-10-CM | POA: Diagnosis not present

## 2024-01-01 DIAGNOSIS — F209 Schizophrenia, unspecified: Secondary | ICD-10-CM | POA: Diagnosis not present

## 2024-01-01 DIAGNOSIS — R7303 Prediabetes: Secondary | ICD-10-CM | POA: Diagnosis not present

## 2024-01-01 DIAGNOSIS — E785 Hyperlipidemia, unspecified: Secondary | ICD-10-CM | POA: Diagnosis not present

## 2024-01-28 DIAGNOSIS — H5213 Myopia, bilateral: Secondary | ICD-10-CM | POA: Diagnosis not present

## 2024-01-28 DIAGNOSIS — H2513 Age-related nuclear cataract, bilateral: Secondary | ICD-10-CM | POA: Diagnosis not present

## 2024-01-28 DIAGNOSIS — H524 Presbyopia: Secondary | ICD-10-CM | POA: Diagnosis not present

## 2024-02-23 DIAGNOSIS — R4189 Other symptoms and signs involving cognitive functions and awareness: Secondary | ICD-10-CM | POA: Diagnosis not present

## 2024-02-23 DIAGNOSIS — F819 Developmental disorder of scholastic skills, unspecified: Secondary | ICD-10-CM | POA: Diagnosis not present

## 2024-02-23 DIAGNOSIS — F209 Schizophrenia, unspecified: Secondary | ICD-10-CM | POA: Diagnosis not present

## 2024-03-10 DIAGNOSIS — M79675 Pain in left toe(s): Secondary | ICD-10-CM | POA: Diagnosis not present

## 2024-03-10 DIAGNOSIS — L84 Corns and callosities: Secondary | ICD-10-CM | POA: Diagnosis not present

## 2024-03-10 DIAGNOSIS — B351 Tinea unguium: Secondary | ICD-10-CM | POA: Diagnosis not present

## 2024-03-10 DIAGNOSIS — M79674 Pain in right toe(s): Secondary | ICD-10-CM | POA: Diagnosis not present

## 2024-03-10 DIAGNOSIS — R2689 Other abnormalities of gait and mobility: Secondary | ICD-10-CM | POA: Diagnosis not present

## 2024-03-10 DIAGNOSIS — M19071 Primary osteoarthritis, right ankle and foot: Secondary | ICD-10-CM | POA: Diagnosis not present

## 2024-03-10 DIAGNOSIS — L605 Yellow nail syndrome: Secondary | ICD-10-CM | POA: Diagnosis not present

## 2024-03-22 DIAGNOSIS — F209 Schizophrenia, unspecified: Secondary | ICD-10-CM | POA: Diagnosis not present

## 2024-03-22 DIAGNOSIS — I10 Essential (primary) hypertension: Secondary | ICD-10-CM | POA: Diagnosis not present

## 2024-03-22 DIAGNOSIS — E785 Hyperlipidemia, unspecified: Secondary | ICD-10-CM | POA: Diagnosis not present

## 2024-03-22 DIAGNOSIS — Z0000 Encounter for general adult medical examination without abnormal findings: Secondary | ICD-10-CM | POA: Diagnosis not present

## 2024-03-22 DIAGNOSIS — E559 Vitamin D deficiency, unspecified: Secondary | ICD-10-CM | POA: Diagnosis not present

## 2024-03-22 DIAGNOSIS — R7303 Prediabetes: Secondary | ICD-10-CM | POA: Diagnosis not present

## 2024-03-22 DIAGNOSIS — F319 Bipolar disorder, unspecified: Secondary | ICD-10-CM | POA: Diagnosis not present

## 2024-04-06 DIAGNOSIS — H9042 Sensorineural hearing loss, unilateral, left ear, with unrestricted hearing on the contralateral side: Secondary | ICD-10-CM | POA: Diagnosis not present

## 2024-04-28 DIAGNOSIS — R4189 Other symptoms and signs involving cognitive functions and awareness: Secondary | ICD-10-CM | POA: Diagnosis not present

## 2024-04-28 DIAGNOSIS — F419 Anxiety disorder, unspecified: Secondary | ICD-10-CM | POA: Diagnosis not present

## 2024-04-28 DIAGNOSIS — F209 Schizophrenia, unspecified: Secondary | ICD-10-CM | POA: Diagnosis not present

## 2024-04-28 DIAGNOSIS — F819 Developmental disorder of scholastic skills, unspecified: Secondary | ICD-10-CM | POA: Diagnosis not present

## 2024-06-01 DIAGNOSIS — R531 Weakness: Secondary | ICD-10-CM | POA: Diagnosis not present

## 2024-06-01 DIAGNOSIS — I639 Cerebral infarction, unspecified: Secondary | ICD-10-CM | POA: Diagnosis not present

## 2024-06-01 DIAGNOSIS — R269 Unspecified abnormalities of gait and mobility: Secondary | ICD-10-CM | POA: Diagnosis not present

## 2024-06-01 DIAGNOSIS — Z1331 Encounter for screening for depression: Secondary | ICD-10-CM | POA: Diagnosis not present

## 2024-06-01 DIAGNOSIS — R2689 Other abnormalities of gait and mobility: Secondary | ICD-10-CM | POA: Diagnosis not present

## 2024-07-13 ENCOUNTER — Ambulatory Visit (INDEPENDENT_AMBULATORY_CARE_PROVIDER_SITE_OTHER): Admitting: Primary Care

## 2024-07-15 ENCOUNTER — Ambulatory Visit (INDEPENDENT_AMBULATORY_CARE_PROVIDER_SITE_OTHER): Admitting: Primary Care

## 2024-07-15 ENCOUNTER — Encounter (INDEPENDENT_AMBULATORY_CARE_PROVIDER_SITE_OTHER): Payer: Self-pay | Admitting: Primary Care

## 2024-07-15 VITALS — BP 123/91 | HR 123 | Temp 97.9°F | Resp 16 | Ht 72.0 in | Wt 255.0 lb

## 2024-07-15 DIAGNOSIS — Z7689 Persons encountering health services in other specified circumstances: Secondary | ICD-10-CM

## 2024-07-15 DIAGNOSIS — Z23 Encounter for immunization: Secondary | ICD-10-CM | POA: Diagnosis not present

## 2024-07-15 DIAGNOSIS — Z760 Encounter for issue of repeat prescription: Secondary | ICD-10-CM

## 2024-07-15 DIAGNOSIS — J4520 Mild intermittent asthma, uncomplicated: Secondary | ICD-10-CM

## 2024-07-15 MED ORDER — CLOPIDOGREL BISULFATE 75 MG PO TABS: 75.0000 mg | ORAL_TABLET | Freq: Every day | ORAL | 1 refills | Status: AC

## 2024-07-15 MED ORDER — BUDESONIDE-FORMOTEROL FUMARATE 160-4.5 MCG/ACT IN AERO: 2.0000 | INHALATION_SPRAY | Freq: Two times a day (BID) | RESPIRATORY_TRACT | 3 refills | Status: AC

## 2024-07-15 MED ORDER — METOPROLOL SUCCINATE ER 100 MG PO TB24
100.0000 mg | ORAL_TABLET | Freq: Every day | ORAL | 1 refills | Status: AC
Start: 2024-07-15 — End: ?

## 2024-07-15 MED ORDER — ALBUTEROL SULFATE HFA 108 (90 BASE) MCG/ACT IN AERS: 2.0000 | INHALATION_SPRAY | Freq: Four times a day (QID) | RESPIRATORY_TRACT | 1 refills | Status: AC | PRN

## 2024-07-15 MED ORDER — OMEPRAZOLE 20 MG PO CPDR
20.0000 mg | DELAYED_RELEASE_CAPSULE | Freq: Every day | ORAL | 1 refills | Status: AC
Start: 2024-07-15 — End: ?

## 2024-07-15 MED ORDER — FUROSEMIDE 20 MG PO TABS: 20.0000 mg | ORAL_TABLET | Freq: Every day | ORAL | 1 refills | Status: AC

## 2024-07-15 NOTE — Progress Notes (Signed)
 New Patient Office Visit  Subjective    Patient ID: [name] [name] male  DOB: 1966/09/13  Age: 58 y.o. MRN: [id number]   CC:  None refills    HPI  [name] is a 58 year old obese male who is in today to establish care.  He voices no complaints or concerns at this time Current Outpatient Medications on File Prior to Visit  Medication Sig Dispense Refill   ALLERGY RELIEF 10 MG tablet Take 10 mg by mouth daily.     benztropine  (COGENTIN ) 1 MG tablet Take 1 mg by mouth 3 (three) times daily.     cholecalciferol (VITAMIN D) 25 MCG (1000 UNIT) tablet Take 2,000 Units by mouth daily.     divalproex  (DEPAKOTE  ER) 500 MG 24 hr tablet Take 1,500 mg by mouth at bedtime.     docusate sodium  (COLACE) 100 MG capsule Take 1 capsule (100 mg total) by mouth 2 (two) times daily. 10 capsule 0   fenofibrate (TRICOR) 48 MG tablet Take 48 mg by mouth daily.     meloxicam (MOBIC) 7.5 MG tablet Take 7.5 mg by mouth daily.     Multiple Vitamins-Minerals (MULTIVITAMIN WITH MINERALS) tablet Take 1 tablet by mouth daily.     paliperidone  (INVEGA ) 6 MG 24 hr tablet Take 6 mg by mouth at bedtime.     thiamine  (VITAMIN B1) 100 MG tablet Take 1 tablet (100 mg total) by mouth daily. 90 tablet 0   No current facility-administered medications on file prior to visit.     Allergies  Allergen Reactions   Cefprozil    Cetirizine Hcl    Doxycycline     Past Medical History:  Diagnosis Date   Acute bronchitis    Asthma    Depression    Hypertension    Schizophrenia (HCC)      Past Surgical History:  Procedure Laterality Date   ORIF ANKLE FRACTURE Right 12/07/2021   Procedure: OPEN REDUCTION INTERNAL FIXATION (ORIF) ANKLE FRACTURE;  Surgeon: Marchia Drivers, MD;  Location: [facility] ORS;  Service: Orthopedics;  Laterality: Right;     History reviewed. No pertinent family history.  Social History   Socioeconomic History   Marital status: Legally Separated    Spouse name: Not on file    Number of children: 1   Years of education: diploma   Highest education level: 12th grade  Occupational History   Not on file  Tobacco Use   Smoking status: Never   Smokeless tobacco: Never  Substance and Sexual Activity   Alcohol use: Not Currently   Drug use: Not Currently   Sexual activity: Not on file  Other Topics Concern   Not on file  Social History Narrative   1 son   Social Drivers of Health   Financial Resource Strain: Low Risk  (12/03/2023)   Received from [facility]   Overall Financial Resource Strain (CARDIA)    Difficulty of Paying Living Expenses: Not hard at all  Food Insecurity: No Food Insecurity (12/03/2023)   Received from [facility]   Hunger Vital Sign    Within the past 12 months, you worried that your food would run out before you got the money to buy more.: Never true    Within the past 12 months, the food you bought just didn't last and you didn't have money to get more.: Never true  Transportation Needs: No Transportation Needs (12/03/2023)   Received from [facility]   PRAPARE - Transportation    In the past 12 months, has lack of transportation kept you from medical appointments or from getting medications?: No    Lack of Transportation (Non-Medical): No  Physical Activity: Inactive (07/15/2024)   Exercise Vital Sign    Days of Exercise per Week: 0 days    Minutes of Exercise per Session: 0 min  Stress: Not on file  Social Connections: Moderately Integrated (07/15/2024)   Social Connection and Isolation Panel    Frequency of Communication with Friends and Family: More than three times a week    Frequency of Social Gatherings with Friends and Family: More than three times a week    Attends Religious Services: More than 4 times per year    Active Member of [employer] or Organizations: Not on file    Attends Banker Meetings: 1 to 4 times per year    Marital Status: Separated  Intimate  Partner Violence: Not At Risk (07/15/2024)   Humiliation, Afraid, Rape, and Kick questionnaire    Fear of Current or Ex-Partner: No    Emotionally Abused: No    Physically Abused: No    Sexually Abused: No       Health Maintenance  Topic Date Due   COVID-19 Vaccine (1) Never done   Hepatitis C Screening  Never done   DTaP/Tdap/Td vaccine (1 - Tdap) Never done   Pneumococcal Vaccine for age over 38 (1 of 2 - PCV) Never done   Hepatitis B Vaccine (1 of 3 - 19+ 3-dose series) Never done   Zoster (Shingles) Vaccine (1 of 2) Never done   Colon Cancer Screening  Never done   Medicare Annual Wellness Visit  03/22/2025   Flu Shot  Completed   HIV Screening  Completed   HPV Vaccine  Aged Out   Meningitis B Vaccine  Aged Out    Objective    BP (!) 123/91 (BP Location: Left Arm, Patient Position: Sitting)   Pulse (!) 123   Temp 97.9 F (36.6 C) (Oral)   Resp 16   Ht 6' (1.829 m)   Wt 255 lb (115.7 kg)   SpO2 96%   BMI 34.58 kg/m     Physical Exam Vitals reviewed.  Constitutional:      Appearance: He is obese.  HENT:     Head: Normocephalic.     Right Ear: Tympanic membrane and external ear normal.     Left Ear: Tympanic membrane and external ear normal.     Nose: Nose normal.  Eyes:     Extraocular Movements: Extraocular movements intact.     Pupils: Pupils are equal, round, and reactive to light.  Cardiovascular:     Rate and Rhythm: Normal rate and regular rhythm.  Pulmonary:     Effort: Pulmonary effort is normal.     Breath sounds: Normal breath sounds.  Abdominal:     General: Bowel sounds are normal. There is distension.     Palpations: Abdomen is soft.  Musculoskeletal:        General: Normal range of motion.  Skin:    General: Skin is warm and dry.  Neurological:     Mental Status: He is oriented to person, place, and time.  Psychiatric:        Mood and Affect: Mood normal.        Behavior: Behavior normal.        Thought Content: Thought content  normal.  Judgment: Judgment normal.      Assessment & Plan:  [name] was seen today for new patient (initial visit).  Diagnoses and all orders for this visit:  Encounter to establish care   Mild intermittent asthma without complication -     Flu vaccine trivalent PF, 6mos and older(Flulaval,Afluria,Fluarix,Fluzone)  Other orders 2/2 Medication refill  -     albuterol  (VENTOLIN  HFA) 108 (90 Base) MCG/ACT inhaler; Inhale 2 puffs into the lungs every 6 (six) hours as needed. -     budesonide -formoterol  (SYMBICORT ) 160-4.5 MCG/ACT inhaler; Inhale 2 puffs into the lungs in the morning and at bedtime. -     furosemide  (LASIX ) 20 MG tablet; Take 1 tablet (20 mg total) by mouth daily. -     metoprolol  succinate (TOPROL -XL) 100 MG 24 hr tablet; Take 1 tablet (100 mg total) by mouth daily. Take with or immediately following a meal. -     omeprazole  (PRILOSEC) 20 MG capsule; Take 1 capsule (20 mg total) by mouth daily. -     clopidogrel  (PLAVIX ) 75 MG tablet; Take 1 tablet (75 mg total) by mouth daily.    Follow-up:  Return in about 3 months (around 10/14/2024).  The above assessment and management plan was discussed with the patient. The patient verbalized understanding of and has agreed to the management plan. Patient is aware to call the clinic if symptoms fail to improve or worsen. Patient is aware when to return to the clinic for a follow-up visit. Patient educated on when it is appropriate to go to the emergency department.   Rosaline Bohr, NP-C

## 2024-07-15 NOTE — Progress Notes (Signed)
 Establish care Medication refill on all rx

## 2024-07-19 ENCOUNTER — Ambulatory Visit (INDEPENDENT_AMBULATORY_CARE_PROVIDER_SITE_OTHER): Admitting: Primary Care

## 2024-07-19 DIAGNOSIS — I10 Essential (primary) hypertension: Secondary | ICD-10-CM | POA: Diagnosis not present

## 2024-07-19 DIAGNOSIS — Z125 Encounter for screening for malignant neoplasm of prostate: Secondary | ICD-10-CM

## 2024-07-19 DIAGNOSIS — E559 Vitamin D deficiency, unspecified: Secondary | ICD-10-CM

## 2024-07-19 DIAGNOSIS — Z1321 Encounter for screening for nutritional disorder: Secondary | ICD-10-CM

## 2024-07-19 DIAGNOSIS — Z79899 Other long term (current) drug therapy: Secondary | ICD-10-CM

## 2024-07-20 LAB — CBC WITH DIFFERENTIAL/PLATELET
Basophils Absolute: 0 x10E3/uL (ref 0.0–0.2)
Basos: 0 %
EOS (ABSOLUTE): 0.5 x10E3/uL — ABNORMAL HIGH (ref 0.0–0.4)
Eos: 6 %
Hematocrit: 46.7 % (ref 37.5–51.0)
Hemoglobin: 15.2 g/dL (ref 13.0–17.7)
Immature Grans (Abs): 0 x10E3/uL (ref 0.0–0.1)
Immature Granulocytes: 0 %
Lymphocytes Absolute: 1.6 x10E3/uL (ref 0.7–3.1)
Lymphs: 17 %
MCH: 30.5 pg (ref 26.6–33.0)
MCHC: 32.5 g/dL (ref 31.5–35.7)
MCV: 94 fL (ref 79–97)
Monocytes Absolute: 0.6 x10E3/uL (ref 0.1–0.9)
Monocytes: 7 %
Neutrophils Absolute: 6.5 x10E3/uL (ref 1.4–7.0)
Neutrophils: 70 %
Platelets: 281 x10E3/uL (ref 150–450)
RBC: 4.99 x10E6/uL (ref 4.14–5.80)
RDW: 13.6 % (ref 11.6–15.4)
WBC: 9.3 x10E3/uL (ref 3.4–10.8)

## 2024-07-20 LAB — CMP14+EGFR
ALT: 43 IU/L (ref 0–44)
AST: 59 IU/L — ABNORMAL HIGH (ref 0–40)
Albumin: 4 g/dL (ref 3.8–4.9)
Alkaline Phosphatase: 84 IU/L (ref 47–123)
BUN/Creatinine Ratio: 10 (ref 9–20)
BUN: 12 mg/dL (ref 6–24)
Bilirubin Total: 0.4 mg/dL (ref 0.0–1.2)
CO2: 25 mmol/L (ref 20–29)
Calcium: 9.6 mg/dL (ref 8.7–10.2)
Chloride: 104 mmol/L (ref 96–106)
Creatinine, Ser: 1.18 mg/dL (ref 0.76–1.27)
Globulin, Total: 3.3 g/dL (ref 1.5–4.5)
Glucose: 84 mg/dL (ref 70–99)
Potassium: 4.4 mmol/L (ref 3.5–5.2)
Sodium: 142 mmol/L (ref 134–144)
Total Protein: 7.3 g/dL (ref 6.0–8.5)
eGFR: 72 mL/min/1.73 (ref >59)

## 2024-07-20 LAB — LIPID PANEL
Chol/HDL Ratio: 4.1 ratio (ref 0.0–5.0)
Cholesterol, Total: 196 mg/dL (ref 100–199)
HDL: 48 mg/dL (ref >39)
LDL Chol Calc (NIH): 115 mg/dL — ABNORMAL HIGH (ref 0–99)
Triglycerides: 187 mg/dL — ABNORMAL HIGH (ref 0–149)
VLDL Cholesterol Cal: 33 mg/dL (ref 5–40)

## 2024-07-20 LAB — PSA, TOTAL AND FREE
PSA, Free Pct: 50 %
PSA, Free: 0.15 ng/mL
Prostate Specific Ag, Serum: 0.3 ng/mL (ref 0.0–4.0)

## 2024-07-21 ENCOUNTER — Ambulatory Visit (INDEPENDENT_AMBULATORY_CARE_PROVIDER_SITE_OTHER): Payer: Self-pay | Admitting: Primary Care

## 2024-07-21 ENCOUNTER — Encounter (INDEPENDENT_AMBULATORY_CARE_PROVIDER_SITE_OTHER): Payer: Self-pay | Admitting: Primary Care

## 2024-07-21 DIAGNOSIS — E782 Mixed hyperlipidemia: Secondary | ICD-10-CM

## 2024-07-21 MED ORDER — ATORVASTATIN CALCIUM 20 MG PO TABS: 20.0000 mg | ORAL_TABLET | Freq: Every day | ORAL | 1 refills | Status: AC

## 2025-01-17 ENCOUNTER — Ambulatory Visit (INDEPENDENT_AMBULATORY_CARE_PROVIDER_SITE_OTHER): Admitting: Primary Care
